# Patient Record
Sex: Male | Born: 1980 | Race: White | Hispanic: No | Marital: Single | Smoking: Current every day smoker
Health system: Southern US, Community
[De-identification: ages and names within clinical notes are randomized; demographics above are authoritative.]

## PROBLEM LIST (undated history)

## (undated) DIAGNOSIS — F1910 Other psychoactive substance abuse, uncomplicated: Secondary | ICD-10-CM

---

## 2003-10-15 ENCOUNTER — Emergency Department (HOSPITAL_COMMUNITY): Admission: EM | Admit: 2003-10-15 | Discharge: 2003-10-15 | Payer: Self-pay | Admitting: Emergency Medicine

## 2005-10-29 ENCOUNTER — Emergency Department (HOSPITAL_COMMUNITY): Admission: EM | Admit: 2005-10-29 | Discharge: 2005-10-29 | Payer: Self-pay | Admitting: Family Medicine

## 2007-07-03 ENCOUNTER — Emergency Department (HOSPITAL_COMMUNITY): Admission: EM | Admit: 2007-07-03 | Discharge: 2007-07-03 | Payer: Self-pay | Admitting: Emergency Medicine

## 2012-03-28 ENCOUNTER — Encounter (HOSPITAL_COMMUNITY): Payer: Self-pay | Admitting: *Deleted

## 2012-03-28 ENCOUNTER — Emergency Department (HOSPITAL_COMMUNITY)
Admission: EM | Admit: 2012-03-28 | Discharge: 2012-03-28 | Disposition: A | Discharge status: Home / Self Care | Payer: Self-pay | Attending: Emergency Medicine | Admitting: Emergency Medicine

## 2012-03-28 DIAGNOSIS — K047 Periapical abscess without sinus: Secondary | ICD-10-CM | POA: Insufficient documentation

## 2012-03-28 DIAGNOSIS — F17200 Nicotine dependence, unspecified, uncomplicated: Secondary | ICD-10-CM | POA: Insufficient documentation

## 2012-03-28 MED ORDER — KETOROLAC TROMETHAMINE 30 MG/ML IJ SOLN
30.0000 mg | Freq: Once | INTRAMUSCULAR | Status: AC
  Administered 2012-03-28: 30 mg via INTRAMUSCULAR
  Filled 2012-03-28: qty 1

## 2012-03-28 MED ORDER — TRAMADOL HCL 50 MG PO TABS: 50.0000 mg | ORAL_TABLET | Freq: Four times a day (QID) | ORAL | Status: DC | PRN

## 2012-03-28 MED ORDER — PENICILLIN V POTASSIUM 500 MG PO TABS: 500.0000 mg | ORAL_TABLET | Freq: Four times a day (QID) | ORAL | Status: DC

## 2012-03-28 MED ORDER — PENICILLIN V POTASSIUM 250 MG PO TABS
500.0000 mg | ORAL_TABLET | Freq: Once | ORAL | Status: AC
  Administered 2012-03-28: 500 mg via ORAL
  Filled 2012-03-28: qty 2

## 2012-03-28 NOTE — ED Notes (Signed)
Patient given discharge paperwork; went over discharge instructions with patient.  Patient instructed to take prescriptions as directed, to finish entire antibiotic prescription completely, to call dentist/referral first thing tomorrow morning, and to return to the ED for new, worsening, or concerning symptoms.

## 2012-03-28 NOTE — ED Notes (Addendum)
C/o L lower tooth pain, hurting on & off for 2-3 months, worse tonight. "think it is infected". Pain radiates into L throat, L ear and HA. (denies: nvd, fever), some dizziness. Last tylenol PTA, last ibuprofen 1 hr ago.

## 2012-03-28 NOTE — ED Provider Notes (Signed)
Medical screening examination/treatment/procedure(s) were performed by non-physician practitioner and as supervising physician I was immediately available for consultation/collaboration.  Olivia Mackie, MD 03/28/12 [phone]

## 2012-03-28 NOTE — ED Notes (Signed)
Patient complaining of dental pain in the bottom left side of his mouth; reports that he has had dental pain for the past two months.  Patient reports that he has not seen a dentist.  Has been taking Tylenol and ibuprofen for the pain.  Patient reports that he does not have a primary dentist.  Patient alert and oriented x4; PERRL present.  Will continue to monitor.

## 2012-03-28 NOTE — ED Provider Notes (Signed)
History     CSN: [id number]  Arrival date & time 03/28/12  8295   First MD Initiated Contact with Patient 03/28/12 0329      Chief Complaint  Patient presents with  . Dental Pain    (Consider location/radiation/quality/duration/timing/severity/associated sxs/prior treatment) HPI Comments: Planing of a large cavity in his left lower second molar that is acutely become more painful tonight.  He taken Tylenol and ibuprofen, without relief.  He does not have a dentist  Patient is a 31 y.o. male presenting with tooth pain. The history is provided by the patient.  Dental PainThe primary symptoms include mouth pain, oral lesions and cough. Primary symptoms do not include dental injury, headaches, fever or sore throat. The symptoms began 6 to 12 hours ago. The symptoms are worsening. The symptoms occur constantly.  Additional symptoms include: dental sensitivity to temperature, gum swelling and ear pain. Additional symptoms do not include: purulent gums, trismus, jaw pain, facial swelling, trouble swallowing and swollen glands.    History reviewed. No pertinent past medical history.  History reviewed. No pertinent past surgical history.  No family history on file.  History  Substance Use Topics  . Smoking status: Current Every Day Smoker  . Smokeless tobacco: Not on file  . Alcohol Use: Yes      Review of Systems  Constitutional: Negative for fever and chills.  HENT: Positive for ear pain and dental problem. Negative for congestion, sore throat, facial swelling, rhinorrhea, trouble swallowing, neck pain and sinus pressure.   Respiratory: Positive for cough.   Gastrointestinal: Negative for nausea.  Neurological: Negative for dizziness and headaches.    Allergies  Review of patient's allergies indicates no known allergies.  Home Medications   Current Outpatient Rx  Name Route Sig Dispense Refill  . PENICILLIN V POTASSIUM 500 MG PO TABS Oral Take 1 tablet (500 mg total) by  mouth 4 (four) times daily. 40 tablet 0  . TRAMADOL HCL 50 MG PO TABS Oral Take 1 tablet (50 mg total) by mouth every 6 (six) hours as needed for pain. 15 tablet 0    BP 139/80  Pulse 73  Temp 98.1 F (36.7 C) (Oral)  Resp 18  SpO2 94%  Physical Exam  Constitutional: He appears well-developed and well-nourished.  HENT:  Head: Normocephalic.  Mouth/Throat:    Eyes: Pupils are equal, round, and reactive to light.  Neck: Normal range of motion.  Cardiovascular: Normal rate.   Pulmonary/Chest: Effort normal.  Abdominal: Soft.  Musculoskeletal: Normal range of motion.  Neurological: He is alert.  Skin: Skin is warm.    ED Course  Procedures (including critical care time)  Labs Reviewed - No data to display No results found.   1. Dental abscess       MDM  Will treat for a dental abscess, and refer to dentist on call        Arman Filter, NP 03/28/12 0345

## 2014-06-19 ENCOUNTER — Emergency Department (HOSPITAL_COMMUNITY)
Admission: EM | Admit: 2014-06-19 | Discharge: 2014-06-20 | Disposition: A | Discharge status: Home / Self Care | Payer: Medicaid Other | Attending: Emergency Medicine | Admitting: Emergency Medicine

## 2014-06-19 ENCOUNTER — Emergency Department (HOSPITAL_COMMUNITY): Payer: Medicaid Other

## 2014-06-19 ENCOUNTER — Encounter (HOSPITAL_COMMUNITY): Payer: Self-pay | Admitting: Emergency Medicine

## 2014-06-19 DIAGNOSIS — W1839XA Other fall on same level, initial encounter: Secondary | ICD-10-CM | POA: Insufficient documentation

## 2014-06-19 DIAGNOSIS — S93401A Sprain of unspecified ligament of right ankle, initial encounter: Secondary | ICD-10-CM | POA: Insufficient documentation

## 2014-06-19 DIAGNOSIS — Y9289 Other specified places as the place of occurrence of the external cause: Secondary | ICD-10-CM | POA: Diagnosis not present

## 2014-06-19 DIAGNOSIS — S99921A Unspecified injury of right foot, initial encounter: Secondary | ICD-10-CM | POA: Diagnosis present

## 2014-06-19 DIAGNOSIS — Z792 Long term (current) use of antibiotics: Secondary | ICD-10-CM | POA: Diagnosis not present

## 2014-06-19 DIAGNOSIS — Y720 Diagnostic and monitoring otorhinolaryngological devices associated with adverse incidents: Secondary | ICD-10-CM | POA: Diagnosis not present

## 2014-06-19 DIAGNOSIS — Y998 Other external cause status: Secondary | ICD-10-CM | POA: Insufficient documentation

## 2014-06-19 DIAGNOSIS — R609 Edema, unspecified: Secondary | ICD-10-CM

## 2014-06-19 DIAGNOSIS — R52 Pain, unspecified: Secondary | ICD-10-CM

## 2014-06-19 DIAGNOSIS — Y9389 Activity, other specified: Secondary | ICD-10-CM | POA: Insufficient documentation

## 2014-06-19 MED ORDER — HYDROCODONE-ACETAMINOPHEN 5-325 MG PO TABS
2.0000 | ORAL_TABLET | Freq: Once | ORAL | Status: AC
  Administered 2014-06-19: 2 via ORAL
  Filled 2014-06-19: qty 2
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
## 2014-06-19 NOTE — ED Notes (Signed)
Pt. stepped on a hole , lost his balance and fell 2 days ago , presents with right foot pain/ swelling onset yesterday , ambulatory .

## 2014-06-20 MED ORDER — HYDROCODONE-ACETAMINOPHEN 5-325 MG PO TABS: 1.0000 | ORAL_TABLET | ORAL | Status: DC | PRN

## 2015-03-09 ENCOUNTER — Emergency Department (HOSPITAL_COMMUNITY): Payer: Medicaid Other

## 2015-03-09 ENCOUNTER — Emergency Department (HOSPITAL_COMMUNITY)
Admission: EM | Admit: 2015-03-09 | Discharge: 2015-03-09 | Disposition: A | Discharge status: Home / Self Care | Payer: Medicaid Other | Attending: Emergency Medicine | Admitting: Emergency Medicine

## 2015-03-09 ENCOUNTER — Encounter (HOSPITAL_COMMUNITY): Payer: Self-pay

## 2015-03-09 DIAGNOSIS — Y9389 Activity, other specified: Secondary | ICD-10-CM | POA: Insufficient documentation

## 2015-03-09 DIAGNOSIS — R092 Respiratory arrest: Secondary | ICD-10-CM | POA: Diagnosis not present

## 2015-03-09 DIAGNOSIS — T401X1A Poisoning by heroin, accidental (unintentional), initial encounter: Secondary | ICD-10-CM

## 2015-03-09 DIAGNOSIS — Y9289 Other specified places as the place of occurrence of the external cause: Secondary | ICD-10-CM | POA: Insufficient documentation

## 2015-03-09 DIAGNOSIS — Z720 Tobacco use: Secondary | ICD-10-CM | POA: Insufficient documentation

## 2015-03-09 DIAGNOSIS — Y998 Other external cause status: Secondary | ICD-10-CM | POA: Diagnosis not present

## 2015-03-09 DIAGNOSIS — R0789 Other chest pain: Secondary | ICD-10-CM | POA: Insufficient documentation

## 2015-03-09 LAB — CBC WITH DIFFERENTIAL/PLATELET
BASOS ABS: 0 10*3/uL (ref 0.0–0.1)
Basophils Relative: 0 % (ref 0–1)
EOS ABS: 0.2 10*3/uL (ref 0.0–0.7)
Eosinophils Relative: 1 % (ref 0–5)
HCT: 45.1 % (ref 39.0–52.0)
HEMOGLOBIN: 15.7 g/dL (ref 13.0–17.0)
LYMPHS ABS: 1.2 10*3/uL (ref 0.7–4.0)
LYMPHS PCT: 6 % — AB (ref 12–46)
MCH: 31.7 pg (ref 26.0–34.0)
MCHC: 34.8 g/dL (ref 30.0–36.0)
MCV: 90.9 fL (ref 78.0–100.0)
Monocytes Absolute: 1.2 10*3/uL — ABNORMAL HIGH (ref 0.1–1.0)
Monocytes Relative: 6 % (ref 3–12)
NEUTROS PCT: 87 % — AB (ref 43–77)
Neutro Abs: 17 10*3/uL — ABNORMAL HIGH (ref 1.7–7.7)
Platelets: 199 10*3/uL (ref 150–400)
RBC: 4.96 MIL/uL (ref 4.22–5.81)
RDW: 13.7 % (ref 11.5–15.5)
WBC: 19.5 10*3/uL — AB (ref 4.0–10.5)

## 2015-03-09 LAB — BASIC METABOLIC PANEL
ANION GAP: 6 (ref 5–15)
BUN: 14 mg/dL (ref 6–20)
CHLORIDE: 104 mmol/L (ref 101–111)
CO2: 29 mmol/L (ref 22–32)
Calcium: 8.6 mg/dL — ABNORMAL LOW (ref 8.9–10.3)
Creatinine, Ser: 1.08 mg/dL (ref 0.61–1.24)
GFR calc Af Amer: >60 mL/min (ref >60)
GFR calc non Af Amer: >60 mL/min (ref >60)
Glucose, Bld: 162 mg/dL — ABNORMAL HIGH (ref 65–99)
POTASSIUM: 3.5 mmol/L (ref 3.5–5.1)
SODIUM: 139 mmol/L (ref 135–145)

## 2015-03-09 LAB — CBG MONITORING, ED: GLUCOSE-CAPILLARY: 157 mg/dL — AB (ref 65–99)

## 2015-03-09 LAB — ETHANOL

## 2015-03-09 MED ORDER — NALOXONE HCL 1 MG/ML IJ SOLN
1.0000 mg | Freq: Once | INTRAMUSCULAR | Status: AC
  Administered 2015-03-09: 1 mg via INTRAVENOUS
  Filled 2015-03-09: qty 2

## 2015-03-09 NOTE — ED Notes (Signed)
Pt oxygen dropped to 89%. Pt placed on 2L oxygen. Oxygen increased to 96%

## 2015-03-09 NOTE — Discharge Instructions (Signed)
Accidental Overdose °A drug overdose occurs when a chemical substance (drug or medication) is used in amounts large enough to overcome a person. This may result in severe illness or death. This is a type of poisoning. Accidental overdoses of medications or other substances come from a variety of reasons. When this happens accidentally, it is often because the person taking the substance does not know enough about what they have taken. Drugs which commonly cause overdose deaths are alcohol, psychotropic medications (medications which affect the mind), pain medications, illegal drugs (street drugs) such as cocaine and heroin, and multiple drugs taken at the same time. It may result from careless behavior (such as over-indulging at a party). Other causes of overdose may include multiple drug use, a lapse in memory, or drug use after a period of no drug use.  °Sometimes overdosing occurs because a person cannot remember if they have taken their medication.  °A common unintentional overdose in young children involves multi-vitamins containing iron. Iron is a part of the hemoglobin molecule in blood. It is used to transport oxygen to living cells. When taken in small amounts, iron allows the body to restock hemoglobin. In large amounts, it causes problems in the body. If this overdose is not treated, it can lead to death. °Never take medicines that show signs of tampering or do not seem quite right. Never take medicines in the dark or in poor lighting. Read the label and check each dose of medicine before you take it. When adults are poisoned, it happens most often through carelessness or lack of information. Taking medicines in the dark or taking medicine prescribed for someone else to treat the same type of problem is a dangerous practice. °SYMPTOMS  °Symptoms of overdose depend on the medication and amount taken. They can vary from over-activity with stimulant over-dosage, to sleepiness from depressants such as  alcohol, narcotics and tranquilizers. Confusion, dizziness, nausea and vomiting may be present. If problems are severe enough coma and death may result. °DIAGNOSIS  °Diagnosis and management are generally straightforward if the drug is known. Otherwise it is more difficult. At times, certain symptoms and signs exhibited by the patient, or blood tests, can reveal the drug in question.  °TREATMENT  °In an emergency department, most patients can be treated with supportive measures. Antidotes may be available if there has been an overdose of opioids or benzodiazepines. A rapid improvement will often occur if this is the cause of overdose. °At home or away from medical care: °· There may be no immediate problems or warning signs in children. °· Not everything works well in all cases of poisoning. °· Take immediate action. Poisons may act quickly. °· If you think someone has swallowed medicine or a household product, and the person is unconscious, having seizures (convulsions), or is not breathing, immediately call for an ambulance. °IF a person is conscious and appears to be doing OK but has swallowed a poison: °· Do not wait to see what effect the poison will have. Immediately call a poison control center (listed in the white pages of your telephone book under "Poison Control" or inside the front cover with other emergency numbers). Some poison control centers have TTY capability for the deaf. Check with your local center if you or someone in your family requires this service. °· Keep the container so you can read the label on the product for ingredients. °· Describe what, when, and how much was taken and the age and condition of the person poisoned.   Inform them if the person is vomiting, choking, drowsy, shows a change in color or temperature of skin, is conscious or unconscious, or is convulsing. °· Do not cause vomiting unless instructed by medical personnel. Do not induce vomiting or force liquids into a person who  is convulsing, unconscious, or very drowsy. °Stay calm and in control.  °· Activated charcoal also is sometimes used in certain types of poisoning and you may wish to add a supply to your emergency medicines. It is available without a prescription. Call a poison control center before using this medication. °PREVENTION  °Thousands of children die every year from unintentional poisoning. This may be from household chemicals, poisoning from carbon monoxide in a car, taking their parent's medications, or simply taking a few iron pills or vitamins with iron. Poisoning comes from unexpected sources. °· Store medicines out of the sight and reach of children, preferably in a locked cabinet. Do not keep medications in a food cabinet. Always store your medicines in a secure place. Get rid of expired medications. °· If you have children living with you or have them as occasional guests, you should have child-resistant caps on your medicine containers. Keep everything out of reach. Child proof your home. °· If you are called to the telephone or to answer the door while you are taking a medicine, take the container with you or put the medicine out of the reach of small children. °· Do not take your medication in front of children. Do not tell your child how good a medication is and how good it is for them. They may get the idea it is more of a treat. °· If you are an adult and have accidentally taken an overdose, you need to consider how this happened and what can be done to prevent it from happening again. If this was from a street drug or alcohol, determine if there is a problem that needs addressing. If you are not sure a problems exists, it is easy to talk to a professional and ask them if they think you have a problem. It is better to handle this problem in this way before it happens again and has a much worse consequence. °Document Released: 09/05/2004 Document Revised: 09/14/2011 Document Reviewed: 02/11/2009 °ExitCare®  Patient Information ©2015 [employer]. This information is not intended to replace advice given to you by your health care provider. Make sure you discuss any questions you have with your health care provider. ° °Narcotic Overdose °A narcotic overdose is the misuse or overuse of a narcotic drug. A narcotic overdose can make you pass out and stop breathing. If you are not treated right away, this can cause permanent brain damage or stop your heart. Medicine may be given to reverse the effects of an overdose. If so, this medicine may bring on withdrawal symptoms. The symptoms may be abdominal cramps, throwing up (vomiting), sweating, chills, and nervousness. °Injecting narcotics can cause more problems than just an overdose. AIDS, hepatitis, and other very serious infections are transmitted by sharing needles and syringes. If you decide to quit using, there are medicines which can help you through the withdrawal period. Trying to quit all at once on your own can be uncomfortable, but not life-threatening. Call your caregiver, Narcotics Anonymous, or any drug and alcohol treatment program for further help.  °Document Released: 07/30/2004 Document Revised: 09/14/2011 Document Reviewed: 05/24/2009 °ExitCare® Patient Information ©2015 [employer]. This information is not intended to replace advice given to you by your health   care provider. Make sure you discuss any questions you have with your health care provider. °Substance Abuse Treatment Programs ° °Intensive Outpatient Programs °[facility]     °[address]      °[place], [place]                   °[phone]      ° °[facility] °[address] °[place], [place] °[phone] ° °[facility]     °(Inpatient and outpatient)     °[address]           °[phone]   ° °[facility] °[phone] (Suboxone and Methadone) ° °[address]      °[place], [place] [address]        °[phone]      ° °[address] °[place], [place] °852-3033 ° °[facility] (Outpatient/Inpatient, Chemical)    °(insurance only) [phone]      °       °[facility] (Groups & Residential) °[place], [place] °[phone] ° °   °[facility]     °[address]     °[place], [place]      °[phone]      ° °[facility] (for caregivers and family) °[address] °[place], [place] °336-299-4655 ° ° ° ° ° °Residential Treatment Programs °[facility]      °[address], [place], [place] [address]  °([phone]      ° °[facility] °[address] [place], [place] [address] °[phone] ° °[facility]        °336-248-8914      ° °[facility] °1-800-659-3381 ° °[facility]             °[address]                                         °[place], [place]                                                °877-615-2722 or 336-784-9470                              ° °[facility] [place] °[address] °[place], [address] °1.877.941.8954 ° °[facility]    °[address]      °[place], [place]     °[phone]      ° °[facility] °[address] °[place], [place] °336-285-9073 ° °[facility]   °[address]     °[place], [place] [address]     °[phone]      °Admissions: 8am-3pm M-F ° °[facility] °[address] °[place], [place] °336-227-7417 ° °[facility]: Residential Program (90 Days)   °[place], [place]      °336-725-8389 or 800-758-6077    ° °[facility] °[place],  °(Walk in Hours over the weekend or by referral) ° °[facility] °[address], [place],  [address] °([phone] ° °Crisis Mobile: [facility]:  1-877-626-1772 (for crisis response 24 hours a day) °[facility]   Hotline:      1-800-256-2452 °Outpatient Psychiatry and Counseling ° °[facility]:  Mobile Crisis Management 24 hours:  1-877-626-1772 ° °[facility] sliding scale fee and walk in schedule: M-F 8am-12pm/1pm-3pm °[address]  °[place], [place] [address] °[phone] ° °[facility] °[address] °[place], [place] [address] °[phone] ° °[facility] ([facility])- new patient walk-in appointments available Monday - Friday 8am -3pm.          °[address] °[place], [place] [address] °336-676-6840 or crisis line- 336-676-6905 ° °[facility]/ Intensive Outpatient Therapy Program °[address] °[place], [place] [address] °336-832-9804 ° °[facility]                  °Crisis Services      °[phone]      °[address]     °[place], [place] [address]                ° °[facility]   °[facility] °[phone] °[address] °[place], [place] [address] ° ° °[facility]          °[address]  °[place], [place] [address]       °([phone] ° °[facility] °[address] °[place], [place] [address] °336-292-1510 ° °[facility]    °[address]    °[place], [place] [address]     °[phone]      ° °Parish McKinney, MD     °[address]     °[place] [address]     °[phone]     °  °[facility] °[address] °[place] [address] ° °[facility]     °[address]     °[place], [place]      °336-542-2076      ° °[facility] °Shamsher Ahluwalia, MD °[address] °[place], [place] [address] °[phone] ° °[facility]     °[address]     °[place], [place] [address]     °336-274-1237      ° °[facility] °[address] °[place], [place] [address] °336-854-4450 °Resources for Temporary Residential Assistance/Crisis Centers ° °[facility]ENT[facility] °[facility] °M-F 8am-3pm   °[address] [place], [place] [address]   336-332-0824 °Services include: laundry, barbering, support groups, case management, phone  & computer access, showers, AA/NA mtgs, mental health/substance abuse nurse, job skills class, disability information, VA assistance, spiritual classes, etc.  ° °HOMELESS SHELTERS ° °[facility]     °[facility]   °[address], [place]     °336.271.5959       °       °[facility] (women and children)       °[address] °[place],  [address] °336-275-0820 °[email or web address] for application and process °Application Required ° °[facility]   °[address]    °[place]  [address]     °[phone]       °             °[facility] °[address] °[place],  [address] °336.273.5572 °336-235-0363(schedule application appt.) °Application Required ° °[facility] (women only)    °[address]     °  [place], [place] [address]     °336-884-1039      °Intake starts 6pm daily °Need valid ID, SSC, & Police report °[facility] [place] °[address] °[place], [place] °336-881-5420 °Application Required ° °[facility] (men only)     °[address]      °[place], [place]     °[phone]      ° °[facility] °(Pregnant women only) °[address] °[place], [place] °336-275-0206 ° °[facility]      °[address]      °[place], [place] [address]     °[phone]      °       °[facility] °[address] °[place], [place] °336-723-1848 °90 day commitment/SA/Application process ° °[facility](men only)     °[address]     °[place], [place]     °[phone]       °Check-in at 7pm     °       °[facility] °[address] °[place], [place] [address] °336-248-6684 °Men/Women/Women and Children must be there by 7 pm ° °[facility] °[place],  [place] °336-722-8721                ° °
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
## 2015-03-09 NOTE — ED Provider Notes (Signed)
CSN: [id number]     Arrival date & time 03/09/15  1031 History  This chart was scribed for Gilda Crease, MD by Chestine Spore, ED Scribe. The patient was seen in room APA02/APA02 at 10:30 AM.     Chief Complaint  Patient presents with  . post cardiac arrest       The history is provided by the patient and the EMS personnel. No language interpreter was used.     HPI Comments: Saleem Coccia is a 34 y.o. male who presents to the Emergency Department via EMS complaining of heroine overdose onset PTA. Pt notes that he did 1 bag of heroine (0.1 grams) that he injected in his arm and he was unresponsive following. Pt notes that he had been up all night prior to injecting herione. Pt does not remember what happened after he injected the drug, he just remembers everyone near him when he woke up. Per EMS pt had CPR completed by the fire department and family prior to the arrival of EMS. Pt was given 2 mg of Narcan inter-nasally by law enforcement and when EMS arrived they gave the pt 2 an additional MG of Narcan through IV and the pt vitals stabilized afterwards.  He states that he is having associated symptoms of left sided chest soreness and fatigue. He denies any other symptoms..Denies taking daily medications.   History reviewed. No pertinent past medical history. History reviewed. No pertinent past surgical history. No family history on file. Social History  Substance Use Topics  . Smoking status: Current Every Day Smoker  . Smokeless tobacco: None  . Alcohol Use: Yes     Comment: occ    Review of Systems  Constitutional: Positive for fatigue.  Respiratory:       Left sided chest wall tenderness  All other systems reviewed and are negative.     Allergies  Review of patient's allergies indicates no known allergies.  Home Medications   Prior to Admission medications   Not on File   BP 123/71 mmHg  Pulse 98  Temp(Src) 97.9 F (36.6 C) (Oral)  Resp 20  Ht  (1.88 m)  Wt  230 lb (104.327 kg)  BMI 29.52 kg/m2  SpO2 97% Physical Exam  Constitutional: He is oriented to person, place, and time. He appears well-developed and well-nourished. No distress.  HENT:  Head: Normocephalic and atraumatic.  Right Ear: Hearing normal.  Left Ear: Hearing normal.  Nose: Nose normal.  Mouth/Throat: Oropharynx is clear and moist and mucous membranes are normal.  Eyes: Conjunctivae and EOM are normal. Pupils are equal, round, and reactive to light.  Neck: Normal range of motion. Neck supple.  Cardiovascular: Regular rhythm, S1 normal and S2 normal.  Exam reveals no gallop and no friction rub.   No murmur heard. Pulmonary/Chest: Effort normal and breath sounds normal. No respiratory distress. He exhibits no tenderness.  Left sided chest wall tenderness  Abdominal: Soft. Normal appearance and bowel sounds are normal. There is no hepatosplenomegaly. There is no tenderness. There is no rebound, no guarding, no tenderness at McBurney's point and negative Murphy's sign. No hernia.  Musculoskeletal: Normal range of motion.  Neurological: He is alert and oriented to person, place, and time. He has normal strength. No cranial nerve deficit or sensory deficit. Coordination normal. GCS eye subscore is 4. GCS verbal subscore is 5. GCS motor subscore is 6.  Skin: Skin is warm, dry and intact. No rash noted. No cyanosis.  Psychiatric: He has a  normal mood and affect. His speech is normal and behavior is normal. Thought content normal.  Nursing note and vitals reviewed.   ED Course  Procedures (including critical care time) COORDINATION OF CARE: 10:36 AM Discussed treatment plan with pt at bedside and pt agreed to plan.    Labs Review Labs Reviewed  CBC WITH DIFFERENTIAL/PLATELET - Abnormal; Notable for the following:    WBC 19.5 (*)    Neutrophils Relative % 87 (*)    Neutro Abs 17.0 (*)    Lymphocytes Relative 6 (*)    Monocytes Absolute 1.2 (*)    All other components within  normal limits  BASIC METABOLIC PANEL - Abnormal; Notable for the following:    Glucose, Bld 162 (*)    Calcium 8.6 (*)    All other components within normal limits  CBG MONITORING, ED - Abnormal; Notable for the following:    Glucose-Capillary 157 (*)    All other components within normal limits  ETHANOL  URINE RAPID DRUG SCREEN, HOSP PERFORMED    Imaging Review Dg Chest 2 View  03/09/2015   CLINICAL DATA:  34 year old male with respiratory distress. Heroin overdose.  EXAM: CHEST  2 VIEW  COMPARISON:  No priors.  FINDINGS: Lung volumes are normal. No consolidative airspace disease. No pleural effusions. No pneumothorax. No pulmonary nodule or mass noted. Pulmonary vasculature and the cardiomediastinal silhouette are within normal limits.  IMPRESSION: No radiographic evidence of acute cardiopulmonary disease.   Electronically Signed   By: Trudie Reed M.D.   On: 03/09/2015 11:09   Jaci Carrel, MD has personally reviewed and evaluated these images and lab results as part of my medical decision-making.    EKG Interpretation   Date/Time:  Saturday March 09 2015 10:34:11 EDT Ventricular Rate:  99 PR Interval:  190 QRS Duration: 83 QT Interval:  327 QTC Calculation: 420 R Axis:   81 Text Interpretation:  Sinus rhythm Anteroseptal infarct, age indeterminate  No previous tracing Confirmed by Ellene Bloodsaw  MD, Rowan Pollman [phone]) on  03/09/2015 10:39:00 AM      MDM   Final diagnoses:  Respiratory arrest  Heroin overdose, accidental or unintentional, initial encounter    Presents to the ER by ambulance after unintentional overdose on heroin. Patient was administered CPR by family and first responders. After they administered Narcan, patient became awake and alert. He is groggy at arrival, but oriented and alert. Patient has spontaneous respirations, normal vital signs. Workup has been unremarkable.  Overdose was accidental. Patient is not homicidal or suicidal. He declines  admission. He is awake, alert and oriented. He has the capacity to declined admission. Patient monitored for an extended period of time, continues to do well. Patient to be discharged, given resources for primary care and heroin abuse.  I personally performed the services described in this documentation, which was scribed in my presence. The recorded information has been reviewed and is accurate.     Gilda Crease, MD 03/09/15 1505

## 2015-05-19 ENCOUNTER — Emergency Department (HOSPITAL_COMMUNITY)
Admission: EM | Admit: 2015-05-19 | Discharge: 2015-05-19 | Disposition: A | Discharge status: Home / Self Care | Payer: Medicaid Other | Attending: Emergency Medicine | Admitting: Emergency Medicine

## 2015-05-19 ENCOUNTER — Encounter (HOSPITAL_COMMUNITY): Payer: Self-pay | Admitting: Emergency Medicine

## 2015-05-19 DIAGNOSIS — F17200 Nicotine dependence, unspecified, uncomplicated: Secondary | ICD-10-CM | POA: Insufficient documentation

## 2015-05-19 DIAGNOSIS — M546 Pain in thoracic spine: Secondary | ICD-10-CM | POA: Diagnosis present

## 2015-05-19 DIAGNOSIS — R112 Nausea with vomiting, unspecified: Secondary | ICD-10-CM | POA: Diagnosis not present

## 2015-05-19 DIAGNOSIS — R109 Unspecified abdominal pain: Secondary | ICD-10-CM | POA: Insufficient documentation

## 2015-05-19 DIAGNOSIS — Z7982 Long term (current) use of aspirin: Secondary | ICD-10-CM | POA: Diagnosis not present

## 2015-05-19 LAB — URINALYSIS, ROUTINE W REFLEX MICROSCOPIC
Bilirubin Urine: NEGATIVE
Glucose, UA: NEGATIVE mg/dL
KETONES UR: NEGATIVE mg/dL
LEUKOCYTES UA: NEGATIVE
NITRITE: NEGATIVE
PROTEIN: NEGATIVE mg/dL
Specific Gravity, Urine: 1.016 (ref 1.005–1.030)
UROBILINOGEN UA: 0.2 mg/dL (ref 0.0–1.0)
pH: 8 (ref 5.0–8.0)

## 2015-05-19 LAB — URINE MICROSCOPIC-ADD ON

## 2015-05-19 MED ORDER — IBUPROFEN 800 MG PO TABS: 800.0000 mg | ORAL_TABLET | Freq: Three times a day (TID) | ORAL | Status: DC

## 2015-05-19 MED ORDER — CYCLOBENZAPRINE HCL 10 MG PO TABS
10.0000 mg | ORAL_TABLET | Freq: Once | ORAL | Status: AC
  Administered 2015-05-19: 10 mg via ORAL
  Filled 2015-05-19: qty 1

## 2015-05-19 MED ORDER — KETOROLAC TROMETHAMINE 30 MG/ML IJ SOLN
30.0000 mg | Freq: Once | INTRAMUSCULAR | Status: AC
  Administered 2015-05-19: 30 mg via INTRAMUSCULAR
  Filled 2015-05-19: qty 1

## 2015-05-19 MED ORDER — OXYCODONE-ACETAMINOPHEN 5-325 MG PO TABS
1.0000 | ORAL_TABLET | Freq: Once | ORAL | Status: AC
  Administered 2015-05-19: 1 via ORAL
  Filled 2015-05-19: qty 1

## 2015-05-19 MED ORDER — ONDANSETRON 4 MG PO TBDP: 4.0000 mg | ORAL_TABLET | Freq: Three times a day (TID) | ORAL | Status: DC | PRN

## 2015-05-19 MED ORDER — PROMETHAZINE HCL 12.5 MG PO TABS
12.5000 mg | ORAL_TABLET | Freq: Once | ORAL | Status: AC
  Administered 2015-05-19: 12.5 mg via ORAL
  Filled 2015-05-19: qty 1

## 2015-05-19 MED ORDER — ONDANSETRON 4 MG PO TBDP
4.0000 mg | ORAL_TABLET | Freq: Once | ORAL | Status: AC | PRN
  Administered 2015-05-19: 4 mg via ORAL
  Filled 2015-05-19: qty 1

## 2015-05-19 MED ORDER — CYCLOBENZAPRINE HCL 10 MG PO TABS: 10.0000 mg | ORAL_TABLET | Freq: Two times a day (BID) | ORAL | Status: DC | PRN

## 2015-05-19 NOTE — ED Provider Notes (Signed)
CSN: [id number]     Arrival date & time 05/19/15  0845 History   First MD Initiated Contact with Patient 05/19/15 0901     Chief Complaint  Patient presents with  . Back Pain     (Consider location/radiation/quality/duration/timing/severity/associated sxs/prior Treatment) HPI   Patient is a 34 year old male who presents to the ED with complaint of left middle back pain. Patient reports he has been having back pain for the past month. He notes while he was at work he was bending over and moving heavy objects at the onset of his back pain. He reports having intermittent sharp pain in his left middle back that worsens with movement or lifting. Patient states his pain worsen last night while he was bending over to grab an object. Pt denies fever, numbness, tingling, saddle anesthesia, loss of bowel or bladder, weakness, IVDU, cancer or recent spinal manipulation. He endorses having nausea with one episode of vomiting in the ED. [facility]. Denies any other recent fall, trauma, injury.   History reviewed. No pertinent past medical history. History reviewed. No pertinent past surgical history. No family history on file. Social History  Substance Use Topics  . Smoking status: Current Every Day Smoker  . Smokeless tobacco: None  . Alcohol Use: Yes     Comment: occ    Review of Systems  Gastrointestinal: Positive for nausea, vomiting and abdominal pain.  Musculoskeletal: Positive for back pain.  All other systems reviewed and are negative.     Allergies  Review of patient's allergies indicates no known allergies.  Home Medications   Prior to Admission medications   Medication Sig Start Date End Date Taking? Authorizing Provider  Aspirin POWD by Does not apply route.   Yes Historical Provider, MD  cyclobenzaprine (FLEXERIL) 10 MG tablet Take 1 tablet (10 mg total) by mouth 2 (two) times daily as needed for muscle spasms. 05/19/15   Barrett HenleNicole Elizabeth Shannara Winbush,  PA-C  ibuprofen (ADVIL,MOTRIN) 800 MG tablet Take 1 tablet (800 mg total) by mouth 3 (three) times daily. 05/19/15   Satira SarkNicole Elizabeth Hue Frick, PA-C   BP 136/100 mmHg  Pulse 74  Temp(Src) 97.6 F (36.4 C) (Oral)  Resp 18  SpO2 100% Physical Exam  Constitutional: He is oriented to person, place, and time. He appears well-developed and well-nourished. No distress.  HENT:  Head: Normocephalic and atraumatic.  Mouth/Throat: Oropharynx is clear and moist. No oropharyngeal exudate.  Eyes: Conjunctivae and EOM are normal. Pupils are equal, round, and reactive to light. Right eye exhibits no discharge. Left eye exhibits no discharge. No scleral icterus.  Neck: Normal range of motion. Neck supple.  Cardiovascular: Normal rate, regular rhythm, normal heart sounds and intact distal pulses.   Pulmonary/Chest: Effort normal and breath sounds normal. No respiratory distress. He has no wheezes. He has no rales. He exhibits no tenderness.  Abdominal: Soft. Bowel sounds are normal. He exhibits no distension and no mass. There is no tenderness. There is no rebound and no guarding.  Musculoskeletal: Normal range of motion. He exhibits tenderness. He exhibits no edema.       Cervical back: He exhibits normal range of motion, no tenderness, no bony tenderness, no swelling, no edema, no deformity, no laceration and no spasm.       Thoracic back: He exhibits tenderness (left paraspinal muscles). He exhibits normal range of motion, no bony tenderness, no swelling, no edema, no deformity, no pain and no spasm.  Lumbar back: He exhibits normal range of motion, no tenderness, no bony tenderness, no swelling, no edema, no deformity, no laceration and no spasm.  No C/T/L midline tenderness. FROM of back. Pt able to stand and ambulate without assistance. 5/5 strength BLE. No erythema or swelling noted to back.  Lymphadenopathy:    He has no cervical adenopathy.  Neurological: He is alert and oriented to person,  place, and time. He has normal strength and normal reflexes. No sensory deficit. Coordination and gait normal.  Skin: Skin is warm and dry. He is not diaphoretic.  Nursing note and vitals reviewed.   ED Course  Procedures (including critical care time) Labs Review Labs Reviewed  URINALYSIS, ROUTINE W REFLEX MICROSCOPIC (NOT AT [facility]) - Abnormal; Notable for the following:    Hgb urine dipstick SMALL (*)    All other components within normal limits  URINE MICROSCOPIC-ADD ON    Imaging Review No results found. I have personally reviewed and evaluated these images and lab results as part of my medical decision-making.    MDM   Final diagnoses:  Left-sided thoracic back pain    Patient presents with left mid back pain. Reports bending down and moving a heavy object at work a month ago bridges. Started. Reports pain is worse with movement, bending or lifting. He states pain worsen last night while he was bending down to pick up an object. Denies any other recent fall, trauma, injury. VSS. Exam reveals tenderness at lower mid left paraspinal muscles, no spasm noted. No midline tenderness. Full range of motion of back, patient able to stand and ambulate. No back pain red flags.  After reviewing patient's chart I saw that he was seen in the ED on 03/09/15 for post cardiac arrest resulting from IV heroin use. I spoke with patient regarding IV drug use and he denies using any IV drugs since then. He reports he took 1 oxycodone last night for pain with reported relief. Exam revealed no signs or symptoms to suggest epidural abscess or cauda equina syndrome. I suspect patient's pain is likely due to musculoskeletal etiology. Pt reports his back pain has improved. Plan to d/c pt home with NSAIDs and muscle relaxant. Strict return precautions given which include fever, numbness, tingling, saddle anesthesia, loss of bowel or bladder, weakness. Pt given resource guide to follow up with PCP.  Evaluation  does not show pathology requring ongoing emergent intervention or admission. Pt is hemodynamically stable and mentating appropriately. Discussed findings/results and plan with patient/guardian, who agrees with plan. All questions answered.   Satira Sark [place], [place] 05/19/15 1349  Laurence Spates, MD 05/19/15 [phone]

## 2015-05-19 NOTE — Discharge Instructions (Signed)
Take your pain medications as prescribed as needed for pain relief. You may also apply heat to her back for pain relief. Follow-up with your primary care provider in 4-5 days. Please return to the Emergency Department if symptoms worsen or new onset of fever, numbness, tingling, groin numbness, loss of bowel or bladder control, weakness.

## 2015-05-19 NOTE — ED Notes (Signed)
Patient states last night he started to experience severe back pain after bending down to move an object.  He states he currently feels nauseous  and has difficulty sitting due to the pain.  Patient in no apparent distress at this time.

## 2016-09-08 DIAGNOSIS — R294 Clicking hip: Secondary | ICD-10-CM | POA: Diagnosis not present

## 2016-11-24 DIAGNOSIS — J40 Bronchitis, not specified as acute or chronic: Secondary | ICD-10-CM | POA: Diagnosis not present

## 2016-11-24 DIAGNOSIS — J019 Acute sinusitis, unspecified: Secondary | ICD-10-CM | POA: Diagnosis not present

## 2016-11-24 DIAGNOSIS — M25521 Pain in right elbow: Secondary | ICD-10-CM | POA: Diagnosis not present

## 2017-09-26 ENCOUNTER — Emergency Department (HOSPITAL_COMMUNITY)
Admission: EM | Admit: 2017-09-26 | Discharge: 2017-09-26 | Disposition: A | Discharge status: Home / Self Care | Payer: Medicaid Other | Attending: Emergency Medicine | Admitting: Emergency Medicine

## 2017-09-26 ENCOUNTER — Other Ambulatory Visit: Payer: Self-pay

## 2017-09-26 ENCOUNTER — Encounter (HOSPITAL_COMMUNITY): Payer: Self-pay | Admitting: *Deleted

## 2017-09-26 DIAGNOSIS — T401X1A Poisoning by heroin, accidental (unintentional), initial encounter: Secondary | ICD-10-CM | POA: Diagnosis present

## 2017-09-26 DIAGNOSIS — F17210 Nicotine dependence, cigarettes, uncomplicated: Secondary | ICD-10-CM | POA: Diagnosis not present

## 2017-09-26 MED ORDER — ACETAMINOPHEN 325 MG PO TABS
650.0000 mg | ORAL_TABLET | Freq: Once | ORAL | Status: AC
  Administered 2017-09-26: 650 mg via ORAL
  Filled 2017-09-26: qty 2

## 2017-09-26 NOTE — ED Provider Notes (Signed)
[facility] EMERGENCY DEPARTMENT Provider Note   CSN: [id number] Arrival date & time: 09/26/17  0259     History   Chief Complaint Chief Complaint  Patient presents with  . Drug Overdose    HPI Ian Ali is a 37 y.o. male.  The history is provided by the patient and the EMS personnel.  He has history of IV drug abuse and comes in after having had an overdose of heroin.  He admits to injecting heroin tonight.  EMS found him with agonal respirations and gave naloxone 2 mg.  He woke up following that.  He is complaining of a headache, but denies any other complaints.  He denies other drug use.  History reviewed. No pertinent past medical history.  There are no active problems to display for this patient.   History reviewed. No pertinent surgical history.      Home Medications    Prior to Admission medications   Medication Sig Start Date End Date Taking? Authorizing Provider  Aspirin POWD by Does not apply route.    [provider]  cyclobenzaprine (FLEXERIL) 10 MG tablet Take 1 tablet (10 mg total) by mouth 2 (two) times daily as needed for muscle spasms. 05/19/15   Barrett Henle, PA-C  ibuprofen (ADVIL,MOTRIN) 800 MG tablet Take 1 tablet (800 mg total) by mouth 3 (three) times daily. 05/19/15   Barrett Henle, PA-C  ondansetron (ZOFRAN ODT) 4 MG disintegrating tablet Take 1 tablet (4 mg total) by mouth every 8 (eight) hours as needed for nausea or vomiting. 05/19/15   Barrett Henle, PA-C    Family History History reviewed. No pertinent family history.  Social History Social History   Tobacco Use  . Smoking status: Current Every Day Smoker    Packs/day: 1.00    Types: Cigarettes  . Smokeless tobacco: Never Used  Substance Use Topics  . Alcohol use: Yes    Comment: occ  . Drug use: Yes    Types: IV    Comment: heroin 4 x a week     Allergies   Patient has no known allergies.   Review of Systems Review of Systems    All other systems reviewed and are negative.    Physical Exam Updated Vital Signs BP (!) 128/91 (BP Location: Right Arm)   Pulse 95   Temp 97.6 F (36.4 C) (Oral)   Resp (!) 23   Ht 5\' 11"  (1.803 m)   Wt 111.1 kg (245 lb)   SpO2 95%   BMI 34.17 kg/m   Physical Exam  Nursing note and vitals reviewed.  37 year old male, resting comfortably and in no acute distress. Vital signs are significant for borderline elevated diastolic blood pressure. Oxygen saturation is 95%, which is normal. Head is normocephalic and atraumatic. PERRLA, EOMI. Oropharynx is clear. Neck is nontender and supple without adenopathy or JVD. Back is nontender and there is no CVA tenderness. Lungs are clear without rales, wheezes, or rhonchi. Chest is nontender. Heart has regular rate and rhythm without murmur. Abdomen is soft, flat, nontender without masses or hepatosplenomegaly and peristalsis is normoactive. Extremities have no cyanosis or edema, full range of motion is present. Skin is warm and dry without rash. Neurologic: Mental status is normal, cranial nerves are intact, there are no motor or sensory deficits.  ED Treatments / Results   EKG EKG Interpretation  Date/Time:  Sunday September 26 2017 03:02:50 EDT Ventricular Rate:  98 PR Interval:    QRS Duration:  84 QT Interval:  349 QTC Calculation: 446 R Axis:   80 Text Interpretation:  Sinus rhythm Normal ECG When compared with ECG of 03/09/2015, No significant change was found Confirmed by Dione BoozeGlick, Aliz Meritt ([id number]) on 09/26/2017 3:31:09 AM   Procedures Procedures  Medications Ordered in ED Medications  acetaminophen (TYLENOL) tablet 650 mg (650 mg Oral Given 09/26/17 0407)     Initial Impression / Assessment and Plan / ED Course  I have reviewed the triage vital signs and the nursing notes.  Pertinent labs & imaging results that were available during my care of the patient were reviewed by me and considered in my medical decision making (see  chart for details).  Opioid overdose-most likely fentanyl.  He is currently maintaining good oxygen saturation on room air.  Old records are reviewed, and he has a prior ED visit for heroin overdose.  He will be observed in the ED for 4 hours.  If he does not require additional naloxone in that time, he can safely be discharged.  7:28 AM He has been observed in the ED for 4hours during which time he has maintained normal blood pressure and normal oxygen saturation without supplemental oxygen.  He is felt to be safe for discharge at this point.  Advised to abstain from illicit drugs.  Final Clinical Impressions(s) / ED Diagnoses   Final diagnoses:  Accidental overdose of heroin, initial encounter [facility](HCC)    ED Discharge Orders    None       Dione BoozeGlick, Baljit Liebert, MD 09/26/17 [phone]

## 2017-09-26 NOTE — Discharge Instructions (Addendum)
Do not use heroin, or any other illegal drug. Only take medication that is prescribed by a doctor or PA or Nurse Practitioner.

## 2017-09-26 NOTE — ED Notes (Signed)
[facility] in with pt

## 2017-09-26 NOTE — ED Triage Notes (Addendum)
Per [facility], they were called for a heroin overdose. Upon [facility] arrival, pt was unresponsive with agonal respirations. Pt was bagged upon arrival, pt did have a pulse. Pt was given 2mg  of Narcan by [facility]. Pt alert and oriented upon arrival. Pt has a hx of iv heroin use. Pt states overdose was unintentional. CBG on scene was 245.  Pt also reports a headache, requesting tylenol.

## 2017-09-26 NOTE — ED Triage Notes (Signed)
Family had started chest compressions on pt for a few minutes.

## 2017-10-06 NOTE — Progress Notes (Deleted)
Subjective: ZO:XWRUEAVWUCC:establish care, *** HPI: Ian Ali is a 37 y.o. male presenting to clinic today for:  1. ***  No past medical history on file. No past surgical history on file. Social History   Socioeconomic History  . Marital status: Single    Spouse name: Not on file  . Number of children: Not on file  . Years of education: Not on file  . Highest education level: Not on file  Occupational History  . Not on file  Social Needs  . Financial resource strain: Not on file  . Food insecurity:    Worry: Not on file    Inability: Not on file  . Transportation needs:    Medical: Not on file    Non-medical: Not on file  Tobacco Use  . Smoking status: Current Every Day Smoker    Packs/day: 1.00    Types: Cigarettes  . Smokeless tobacco: Never Used  Substance and Sexual Activity  . Alcohol use: Yes    Comment: occ  . Drug use: Yes    Types: IV    Comment: heroin 4 x a week  . Sexual activity: Not on file  Lifestyle  . Physical activity:    Days per week: Not on file    Minutes per session: Not on file  . Stress: Not on file  Relationships  . Social connections:    Talks on phone: Not on file    Gets together: Not on file    Attends religious service: Not on file    Active member of club or organization: Not on file    Attends meetings of clubs or organizations: Not on file    Relationship status: Not on file  . Intimate partner violence:    Fear of current or ex partner: Not on file    Emotionally abused: Not on file    Physically abused: Not on file    Forced sexual activity: Not on file  Other Topics Concern  . Not on file  Social History Narrative  . Not on file   No outpatient medications have been marked as taking for the 10/08/17 encounter (Appointment) with Raliegh IpGottschalk, Wilfrido Luedke M, DO.   No family history on file. No Known Allergies   Health Maintenance: ***  Flu Vaccine: {YES/NO/WILD JWJXB:14782}CARDS:18581}  Tdap Vaccine: {YES/NO/WILD NFAOZ:30865}CARDS:18581}  - every  2030yrs - (<3 lifetime doses or unknown): all wounds -- look up need for Tetanus IG - (>=3 lifetime doses): clean/minor wound if >2930yrs from previous; all other wounds if >5523yrs from previous Zoster Vaccine: {YES/NO/WILD CARDS:18581} (those >50yo, once) Pneumonia Vaccine: {YES/NO/WILD HQION:62952}CARDS:18581} (those w/ risk factors) - (<71102yr) Both: Immunocompromised, cochlear implant, CSF leak, asplenic, sickle cell, Chronic Renal Failure - (<58102yr) PPSV-23 only: Heart dz, lung disease, DM, tobacco abuse, alcoholism, cirrhosis/liver disease. - (>15102yr): PPSV13 then PPSV23 in 6-12mths;  - (>62102yr): repeat PPSV23 once if pt received prior to 36yo and 2923yrs have passed  ROS: Per HPI  Objective: Office vital signs reviewed. There were no vitals taken for this visit.  Physical Examination:  General: Awake, alert, *** nourished, No acute distress HEENT: Normal    Neck: No masses palpated. No lymphadenopathy    Ears: Tympanic membranes intact, normal light reflex, no erythema, no bulging    Eyes: PERRLA, extraocular movement in tact, sclera ***    Nose: nasal turbinates moist, *** nasal discharge    Throat: moist mucus membranes, no erythema, *** tonsillar exudate.  Airway is patent Cardio: regular rate and rhythm, S1S2  heard, no murmurs appreciated Pulm: clear to auscultation bilaterally, no wheezes, rhonchi or rales; normal work of breathing on room air GI: soft, non-tender, non-distended, bowel sounds present x4, no hepatomegaly, no splenomegaly, no masses GU: external vaginal tissue ***, cervix ***, *** punctate lesions on cervix appreciated, *** discharge from cervical os, *** bleeding, *** cervical motion tenderness, *** abdominal/ adnexal masses Extremities: warm, well perfused, No edema, cyanosis or clubbing; +*** pulses bilaterally MSK: *** gait and *** station Skin: dry; intact; no rashes or lesions Neuro: *** Strength and light touch sensation grossly intact, *** DTRs ***/4  Assessment/ Plan: 37  y.o. male   No problem-specific Assessment & Plan notes found for this encounter.   Raliegh Ip, DO [facility] [phone]

## 2017-10-08 ENCOUNTER — Ambulatory Visit: Payer: Self-pay | Admitting: Family Medicine

## 2017-10-08 ENCOUNTER — Encounter: Payer: Self-pay | Admitting: Family Medicine

## 2018-01-21 DIAGNOSIS — S63614A Unspecified sprain of right ring finger, initial encounter: Secondary | ICD-10-CM | POA: Diagnosis not present

## 2018-02-24 DIAGNOSIS — G8929 Other chronic pain: Secondary | ICD-10-CM | POA: Diagnosis not present

## 2018-02-24 DIAGNOSIS — M7031 Other bursitis of elbow, right elbow: Secondary | ICD-10-CM | POA: Diagnosis not present

## 2018-02-24 DIAGNOSIS — M79641 Pain in right hand: Secondary | ICD-10-CM | POA: Diagnosis not present

## 2018-03-16 DIAGNOSIS — M19041 Primary osteoarthritis, right hand: Secondary | ICD-10-CM | POA: Diagnosis not present

## 2018-03-16 DIAGNOSIS — M7021 Olecranon bursitis, right elbow: Secondary | ICD-10-CM | POA: Diagnosis not present

## 2018-10-02 ENCOUNTER — Encounter (HOSPITAL_COMMUNITY): Payer: Self-pay | Admitting: Emergency Medicine

## 2018-10-02 ENCOUNTER — Emergency Department (HOSPITAL_COMMUNITY)
Admission: EM | Admit: 2018-10-02 | Discharge: 2018-10-02 | Disposition: A | Discharge status: Home / Self Care | Payer: Medicaid Other | Attending: Emergency Medicine | Admitting: Emergency Medicine

## 2018-10-02 ENCOUNTER — Other Ambulatory Visit: Payer: Self-pay

## 2018-10-02 DIAGNOSIS — Y939 Activity, unspecified: Secondary | ICD-10-CM | POA: Diagnosis not present

## 2018-10-02 DIAGNOSIS — F329 Major depressive disorder, single episode, unspecified: Secondary | ICD-10-CM

## 2018-10-02 DIAGNOSIS — F17210 Nicotine dependence, cigarettes, uncomplicated: Secondary | ICD-10-CM | POA: Diagnosis not present

## 2018-10-02 DIAGNOSIS — M7021 Olecranon bursitis, right elbow: Secondary | ICD-10-CM | POA: Insufficient documentation

## 2018-10-02 DIAGNOSIS — F32A Depression, unspecified: Secondary | ICD-10-CM

## 2018-10-02 DIAGNOSIS — M7031 Other bursitis of elbow, right elbow: Secondary | ICD-10-CM | POA: Diagnosis not present

## 2018-10-02 HISTORY — DX: Other psychoactive substance abuse, uncomplicated: F19.10

## 2018-10-02 LAB — CBC
HCT: 47.2 % (ref 39.0–52.0)
HEMOGLOBIN: 16.2 g/dL (ref 13.0–17.0)
MCH: 30.6 pg (ref 26.0–34.0)
MCHC: 34.3 g/dL (ref 30.0–36.0)
MCV: 89.2 fL (ref 80.0–100.0)
Platelets: 255 10*3/uL (ref 150–400)
RBC: 5.29 MIL/uL (ref 4.22–5.81)
RDW: 13.2 % (ref 11.5–15.5)
WBC: 12 10*3/uL — AB (ref 4.0–10.5)
nRBC: 0 % (ref 0.0–0.2)

## 2018-10-02 LAB — COMPREHENSIVE METABOLIC PANEL
ALBUMIN: 4.7 g/dL (ref 3.5–5.0)
ALK PHOS: 73 U/L (ref 38–126)
ALT: 34 U/L (ref 0–44)
AST: 24 U/L (ref 15–41)
Anion gap: 13 (ref 5–15)
BILIRUBIN TOTAL: 0.5 mg/dL (ref 0.3–1.2)
BUN: 16 mg/dL (ref 6–20)
CALCIUM: 9.7 mg/dL (ref 8.9–10.3)
CO2: 22 mmol/L (ref 22–32)
Chloride: 104 mmol/L (ref 98–111)
Creatinine, Ser: 1.25 mg/dL — ABNORMAL HIGH (ref 0.61–1.24)
GFR calc Af Amer: >60 mL/min (ref >60)
GFR calc non Af Amer: >60 mL/min (ref >60)
GLUCOSE: 106 mg/dL — AB (ref 70–99)
POTASSIUM: 3.8 mmol/L (ref 3.5–5.1)
Sodium: 139 mmol/L (ref 135–145)
Total Protein: 8.2 g/dL — ABNORMAL HIGH (ref 6.5–8.1)

## 2018-10-02 LAB — ETHANOL

## 2018-10-02 MED ORDER — KETOROLAC TROMETHAMINE 60 MG/2ML IM SOLN
60.0000 mg | Freq: Once | INTRAMUSCULAR | Status: AC
  Administered 2018-10-02: 60 mg via INTRAMUSCULAR
  Filled 2018-10-02: qty 2

## 2018-10-02 MED ORDER — PREDNISONE 10 MG PO TABS: ORAL_TABLET | ORAL | 0 refills | Status: DC

## 2018-10-02 MED ORDER — NAPROXEN 500 MG PO TABS: 500.0000 mg | ORAL_TABLET | Freq: Two times a day (BID) | ORAL | 0 refills | Status: DC

## 2018-10-02 NOTE — ED Triage Notes (Addendum)
Pt states he has problem with heroin and ETOH use and is depressed. States he thought about killing self last night but did not really have a plan. States he was "just down". Last used heroin around 2-3pm. Pt also has chronic R elbow pain and swelling. States that he thinks he needs surgery.

## 2018-10-02 NOTE — Discharge Instructions (Addendum)
Ice pack to elbow.  Prescription for anti-inflammatory medicine and prednisone.  Recommend Narcotics Anonymous and counseling.  Number for [employer] given.

## 2018-10-02 NOTE — ED Provider Notes (Signed)
[facility] EMERGENCY DEPARTMENT Provider Note   CSN: [id number] Arrival date & time: 10/02/18  1857    History   Chief Complaint Chief Complaint  Patient presents with  . V70.1    HPI Ian Ali is a 38 y.o. male.     Patient is depressed.  He is not currently suicidal.  He recently injected heroin and snorted amphetamines.  He also drinks alcohol.  He does not have a steady job.  He lives with his parents and helps him around the house.  Review of systems positive for chronic intermittent inflamed right elbow.  He has not been to counseling     Past Medical History:  Diagnosis Date  . Substance abuse (HCC)     There are no active problems to display for this patient.   History reviewed. No pertinent surgical history.      Home Medications    Prior to Admission medications   Medication Sig Start Date End Date Taking? Authorizing Provider  Aspirin POWD by Does not apply route.    [provider]  naproxen (NAPROSYN) 500 MG tablet Take 1 tablet (500 mg total) by mouth 2 (two) times daily. 10/02/18   Donnetta Hutching, MD  predniSONE (DELTASONE) 10 MG tablet 3 tabs for 3 days, 2 tabs for 3 days, 1 tab for 3 days. 10/02/18   Donnetta Hutching, MD    Family History No family history on file.  Social History Social History   Tobacco Use  . Smoking status: Current Every Day Smoker    Packs/day: 1.00    Types: Cigarettes  . Smokeless tobacco: Never Used  Substance Use Topics  . Alcohol use: Yes    Comment: occ  . Drug use: Yes    Types: IV    Comment: heroin 4 x a week     Allergies   Patient has no known allergies.   Review of Systems Review of Systems  All other systems reviewed and are negative.    Physical Exam Updated Vital Signs BP (!) 153/93 (BP Location: Right Arm)   Pulse (!) 105   Temp 98.7 F (37.1 C) (Oral)   Resp 18   Ht 6' (1.829 m)   Wt 115.7 kg   SpO2 98%   BMI 34.58 kg/m   Physical Exam Vitals signs and nursing note  reviewed.  Constitutional:      Appearance: He is well-developed.  HENT:     Head: Normocephalic and atraumatic.  Eyes:     Conjunctiva/sclera: Conjunctivae normal.  Neck:     Musculoskeletal: Neck supple.  Cardiovascular:     Rate and Rhythm: Normal rate and regular rhythm.  Pulmonary:     Effort: Pulmonary effort is normal.     Breath sounds: Normal breath sounds.  Abdominal:     General: Bowel sounds are normal.     Palpations: Abdomen is soft.  Musculoskeletal:     Comments: Slightly tender tip of right elbow  Skin:    General: Skin is warm and dry.  Neurological:     Mental Status: He is alert and oriented to person, place, and time.  Psychiatric:     Comments: Flat affect, depressed      ED Treatments / Results  Labs (all labs ordered are listed, but only abnormal results are displayed) Labs Reviewed  COMPREHENSIVE METABOLIC PANEL - Abnormal; Notable for the following components:      Result Value   Glucose, Bld 106 (*)    Creatinine, Ser  1.25 (*)    Total Protein 8.2 (*)    All other components within normal limits  CBC - Abnormal; Notable for the following components:   WBC 12.0 (*)    All other components within normal limits  ETHANOL  RAPID URINE DRUG SCREEN, HOSP PERFORMED    EKG None  Radiology No results found.  Procedures Procedures (including critical care time)  Medications Ordered in ED Medications  ketorolac (TORADOL) injection 60 mg (60 mg Intramuscular Given 10/02/18 2102)     Initial Impression / Assessment and Plan / ED Course  I have reviewed the triage vital signs and the nursing notes.  Pertinent labs & imaging results that were available during my care of the patient were reviewed by me and considered in my medical decision making (see chart for details).        Patient presents with depression and polysubstance abuse.  He is not currently suicidal.  Does not meet inpatient criteria.  Recommended Narcotics Anonymous and  outpatient counseling.  Rx Naprosyn 500 mg and prednisone for his right elbow.  Final Clinical Impressions(s) / ED Diagnoses   Final diagnoses:  Depression, unspecified depression type  Olecranon bursitis of right elbow    ED Discharge Orders         Ordered    naproxen (NAPROSYN) 500 MG tablet  2 times daily     10/02/18 2050    predniSONE (DELTASONE) 10 MG tablet     10/02/18 2050           Donnetta Hutching, MD 10/02/18 2148

## 2019-03-18 ENCOUNTER — Emergency Department (HOSPITAL_COMMUNITY): Payer: Medicaid Other

## 2019-03-18 ENCOUNTER — Emergency Department (HOSPITAL_COMMUNITY)
Admission: EM | Admit: 2019-03-18 | Discharge: 2019-03-18 | Disposition: A | Discharge status: Home / Self Care | Payer: Medicaid Other | Attending: Emergency Medicine | Admitting: Emergency Medicine

## 2019-03-18 ENCOUNTER — Encounter (HOSPITAL_COMMUNITY): Payer: Self-pay | Admitting: Emergency Medicine

## 2019-03-18 ENCOUNTER — Other Ambulatory Visit: Payer: Self-pay

## 2019-03-18 DIAGNOSIS — N200 Calculus of kidney: Secondary | ICD-10-CM | POA: Diagnosis not present

## 2019-03-18 DIAGNOSIS — R1030 Lower abdominal pain, unspecified: Secondary | ICD-10-CM | POA: Diagnosis present

## 2019-03-18 DIAGNOSIS — Z79899 Other long term (current) drug therapy: Secondary | ICD-10-CM | POA: Diagnosis not present

## 2019-03-18 DIAGNOSIS — F17210 Nicotine dependence, cigarettes, uncomplicated: Secondary | ICD-10-CM | POA: Diagnosis not present

## 2019-03-18 DIAGNOSIS — K529 Noninfective gastroenteritis and colitis, unspecified: Secondary | ICD-10-CM | POA: Insufficient documentation

## 2019-03-18 LAB — COMPREHENSIVE METABOLIC PANEL
ALT: 28 U/L (ref 0–44)
AST: 21 U/L (ref 15–41)
Albumin: 4.8 g/dL (ref 3.5–5.0)
Alkaline Phosphatase: 64 U/L (ref 38–126)
Anion gap: 13 (ref 5–15)
BUN: 15 mg/dL (ref 6–20)
CO2: 24 mmol/L (ref 22–32)
Calcium: 9.8 mg/dL (ref 8.9–10.3)
Chloride: 100 mmol/L (ref 98–111)
Creatinine, Ser: 1.12 mg/dL (ref 0.61–1.24)
GFR calc Af Amer: >60 mL/min (ref >60)
GFR calc non Af Amer: >60 mL/min (ref >60)
Glucose, Bld: 146 mg/dL — ABNORMAL HIGH (ref 70–99)
Potassium: 3.3 mmol/L — ABNORMAL LOW (ref 3.5–5.1)
Sodium: 137 mmol/L (ref 135–145)
Total Bilirubin: 1.1 mg/dL (ref 0.3–1.2)
Total Protein: 9 g/dL — ABNORMAL HIGH (ref 6.5–8.1)

## 2019-03-18 LAB — URINALYSIS, ROUTINE W REFLEX MICROSCOPIC
Bacteria, UA: NONE SEEN
Bilirubin Urine: NEGATIVE
Glucose, UA: NEGATIVE mg/dL
Ketones, ur: NEGATIVE mg/dL
Leukocytes,Ua: NEGATIVE
Nitrite: NEGATIVE
Protein, ur: >=300 mg/dL — AB
Specific Gravity, Urine: 1.022 (ref 1.005–1.030)
pH: 6 (ref 5.0–8.0)

## 2019-03-18 LAB — LIPASE, BLOOD: Lipase: 25 U/L (ref 11–51)

## 2019-03-18 LAB — CBC
HCT: 53.6 % — ABNORMAL HIGH (ref 39.0–52.0)
Hemoglobin: 18.1 g/dL — ABNORMAL HIGH (ref 13.0–17.0)
MCH: 29.2 pg (ref 26.0–34.0)
MCHC: 33.8 g/dL (ref 30.0–36.0)
MCV: 86.5 fL (ref 80.0–100.0)
Platelets: 289 10*3/uL (ref 150–400)
RBC: 6.2 MIL/uL — ABNORMAL HIGH (ref 4.22–5.81)
RDW: 13.8 % (ref 11.5–15.5)
WBC: 12.1 10*3/uL — ABNORMAL HIGH (ref 4.0–10.5)
nRBC: 0 % (ref 0.0–0.2)

## 2019-03-18 MED ORDER — SODIUM CHLORIDE 0.9 % IV SOLN
INTRAVENOUS | Status: DC
  Administered 2019-03-18: 18:00:00 via INTRAVENOUS

## 2019-03-18 MED ORDER — SODIUM CHLORIDE 0.9 % IV BOLUS
1000.0000 mL | Freq: Once | INTRAVENOUS | Status: AC
  Administered 2019-03-18: 1000 mL via INTRAVENOUS

## 2019-03-18 MED ORDER — IOHEXOL 300 MG/ML  SOLN
100.0000 mL | Freq: Once | INTRAMUSCULAR | Status: AC | PRN
  Administered 2019-03-18: 100 mL via INTRAVENOUS

## 2019-03-18 MED ORDER — ONDANSETRON 4 MG PO TBDP: 4.0000 mg | ORAL_TABLET | Freq: Three times a day (TID) | ORAL | 1 refills | Status: DC | PRN

## 2019-03-18 MED ORDER — ONDANSETRON HCL 4 MG/2ML IJ SOLN
4.0000 mg | Freq: Once | INTRAMUSCULAR | Status: AC
  Administered 2019-03-18: 4 mg via INTRAVENOUS
  Filled 2019-03-18: qty 2

## 2019-03-18 NOTE — ED Provider Notes (Signed)
[facility] EMERGENCY DEPARTMENT Provider Note   CSN: [id number] Arrival date & time: 03/18/19  1335     History   Chief Complaint Chief Complaint  Patient presents with   Abdominal Pain    HPI Ian Ali is a 38 y.o. male.     Patient with complaint of lower abdominal pain.  Somewhat suprapubic.  Little bit of left side.  Ongoing for 3 days.  Associated with nausea vomiting and some loose bowel movements.  On Thursday evening patient vomited about 15 times.  Today's vomited twice.     Past Medical History:  Diagnosis Date   Substance abuse (HCC)     There are no active problems to display for this patient.   History reviewed. No pertinent surgical history.      Home Medications    Prior to Admission medications   Medication Sig Start Date End Date Taking? Authorizing Provider  gabapentin (NEURONTIN) 300 MG capsule Take 1,200 mg by mouth at bedtime. 01/23/19  Yes [provider]  SUBOXONE 8-2 MG FILM Place 1 Film under the tongue every 8 (eight) hours. 03/09/19  Yes [provider]  ondansetron (ZOFRAN ODT) 4 MG disintegrating tablet Take 1 tablet (4 mg total) by mouth every 8 (eight) hours as needed. 03/18/19   Vanetta MuldersZackowski, [place] Lusty, MD    Family History No family history on file.  Social History Social History   Tobacco Use   Smoking status: Current Every Day Smoker    Packs/day: 1.00    Types: Cigarettes   Smokeless tobacco: Never Used  Substance Use Topics   Alcohol use: Not Currently    Comment: occ   Drug use: Not Currently    Types: IV    Comment: heroin 4 x a week. Quit 4 months ago     Allergies   Patient has no known allergies.   Review of Systems Review of Systems  Constitutional: Negative for chills and fever.  HENT: Negative for congestion, rhinorrhea and sore throat.   Eyes: Negative for visual disturbance.  Respiratory: Negative for cough and shortness of breath.   Cardiovascular: Negative for chest pain and leg  swelling.  Gastrointestinal: Positive for abdominal pain, diarrhea, nausea and vomiting.  Genitourinary: Negative for dysuria.  Musculoskeletal: Negative for back pain and neck pain.  Skin: Negative for rash.  Neurological: Negative for dizziness, light-headedness and headaches.  Hematological: Does not bruise/bleed easily.  Psychiatric/Behavioral: Negative for confusion.     Physical Exam Updated Vital Signs BP (!) 150/86    Pulse 91    Temp 98.3 F (36.8 C) (Oral)    Resp 19    Ht 1.803 m (5\' 11" )    Wt 108.9 kg    SpO2 99%    BMI 33.47 kg/m   Physical Exam Vitals signs and nursing note reviewed.  Constitutional:      General: He is not in acute distress.    Appearance: Normal appearance. He is well-developed.  HENT:     Head: Normocephalic and atraumatic.  Eyes:     Extraocular Movements: Extraocular movements intact.     Conjunctiva/sclera: Conjunctivae normal.     Pupils: Pupils are equal, round, and reactive to light.  Neck:     Musculoskeletal: Normal range of motion and neck supple.  Cardiovascular:     Rate and Rhythm: Normal rate and regular rhythm.     Heart sounds: No murmur.  Pulmonary:     Effort: Pulmonary effort is normal. No respiratory distress.  Breath sounds: Normal breath sounds.  Abdominal:     Palpations: Abdomen is soft.     Tenderness: There is no abdominal tenderness. There is no guarding.  Musculoskeletal: Normal range of motion.  Skin:    General: Skin is warm and dry.  Neurological:     General: No focal deficit present.     Mental Status: He is alert and oriented to person, place, and time.      ED Treatments / Results  Labs (all labs ordered are listed, but only abnormal results are displayed) Labs Reviewed  COMPREHENSIVE METABOLIC PANEL - Abnormal; Notable for the following components:      Result Value   Potassium 3.3 (*)    Glucose, Bld 146 (*)    Total Protein 9.0 (*)    All other components within normal limits  CBC -  Abnormal; Notable for the following components:   WBC 12.1 (*)    RBC 6.20 (*)    Hemoglobin 18.1 (*)    HCT 53.6 (*)    All other components within normal limits  URINALYSIS, ROUTINE W REFLEX MICROSCOPIC - Abnormal; Notable for the following components:   Hgb urine dipstick MODERATE (*)    Protein, ur >=300 (*)    All other components within normal limits  LIPASE, BLOOD    EKG None  Radiology Ct Abdomen Pelvis W Contrast  Result Date: 03/18/2019 CLINICAL DATA:  38 year old male with abdominal and pelvic pain for 2 days. EXAM: CT ABDOMEN AND PELVIS WITH CONTRAST TECHNIQUE: Multidetector CT imaging of the abdomen and pelvis was performed using the standard protocol following bolus administration of intravenous contrast. CONTRAST:  OMNIPAQUE IOHEXOL 300 MG/ML  SOLN COMPARISON:  None. FINDINGS: Lower chest: No acute abnormality. Hepatobiliary: The liver and gallbladder are unremarkable. No biliary dilatation. Pancreas: Unremarkable Spleen: Unremarkable Adrenals/Urinary Tract: The kidneys, adrenal glands and bladder are unremarkable except for a punctate nonobstructing RIGHT renal calculus and a small RIGHT renal cyst. Stomach/Bowel: Stomach is within normal limits. Appendix appears normal. No evidence of bowel wall thickening, distention, or inflammatory changes. Vascular/Lymphatic: No significant vascular findings are present. No enlarged abdominal or pelvic lymph nodes. Reproductive: Prostate is unremarkable. Other: No ascites, focal collection, pneumoperitoneum or abdominal wall hernia. Musculoskeletal: No acute or suspicious bony abnormalities identified. Bilateral L5 pars defects noted without spondylolisthesis. IMPRESSION: 1. No evidence of acute abnormality. No CT findings to suggest a cause for this patient's abdominal pain. 2. Punctate nonobstructing RIGHT renal calculus. 3. 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 Electronically Signed   By: Harmon Pier M.D.   On:  03/18/2019 19:23    Procedures Procedures (including critical care time)  Medications Ordered in ED Medications  0.9 %  sodium chloride infusion ( Intravenous New Bag/Given 03/18/19 1751)  sodium chloride 0.9 % bolus 1,000 mL (1,000 mLs Intravenous New Bag/Given 03/18/19 1744)  ondansetron (ZOFRAN) injection 4 mg (4 mg Intravenous Given 03/18/19 1744)  iohexol (OMNIPAQUE) 300 MG/ML solution 100 mL (100 mLs Intravenous Contrast Given 03/18/19 1847)     Initial Impression / Assessment and Plan / ED Course  I have reviewed the triage vital signs and the nursing notes.  Pertinent labs & imaging results that were available during my care of the patient were reviewed by me and considered in my medical decision making (see chart for details).      CT scan of the abdomen without any acute findings.  Labs without any significant abnormality.  Urine not consistent with urinary tract infection.  There was some protein in the urine.  Mild leukocytosis with a white count of 12,000.  Patient feels much better after IV fluid hydration.  Symptoms may be consistent with a Gaster enteritis.  No evidence of any acute abdominal process.  Will discharge home work note provided to be out of work till Monday.  Will have patient take Zofran as needed for nausea and vomiting.  He will return for any new or worse symptoms.    Final Clinical Impressions(s) / ED Diagnoses   Final diagnoses:  Lower abdominal pain  Gastroenteritis    ED Discharge Orders         Ordered    ondansetron (ZOFRAN ODT) 4 MG disintegrating tablet  Every 8 hours PRN     03/18/19 1954           Fredia Sorrow, MD 03/18/19 2000

## 2019-03-18 NOTE — Discharge Instructions (Addendum)
Work-up including CT scan of the abdomen without any acute findings.  Symptoms may be secondary to a viral gastroenteritis.  Take the Zofran as needed for nausea and vomiting.  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  Return for any new or worse symptoms.

## 2019-03-18 NOTE — ED Triage Notes (Signed)
Pt mid lower abdominal pain with n/v/d x 2 days. Pt denies fever.

## 2019-04-26 DIAGNOSIS — Z20828 Contact with and (suspected) exposure to other viral communicable diseases: Secondary | ICD-10-CM | POA: Diagnosis not present

## 2019-05-19 ENCOUNTER — Other Ambulatory Visit: Payer: Self-pay

## 2019-05-19 DIAGNOSIS — Z20822 Contact with and (suspected) exposure to covid-19: Secondary | ICD-10-CM

## 2019-05-19 DIAGNOSIS — Z20828 Contact with and (suspected) exposure to other viral communicable diseases: Secondary | ICD-10-CM | POA: Diagnosis not present

## 2019-05-21 LAB — NOVEL CORONAVIRUS, NAA: SARS-CoV-2, NAA: NOT DETECTED

## 2019-05-22 ENCOUNTER — Telehealth: Payer: Self-pay | Admitting: *Deleted

## 2019-05-22 NOTE — Telephone Encounter (Signed)
Patient called requesting COVID results ,was given negative results and also signed  up with my chart .

## 2019-09-27 ENCOUNTER — Other Ambulatory Visit: Payer: Self-pay

## 2019-09-27 ENCOUNTER — Encounter (HOSPITAL_COMMUNITY): Payer: Self-pay | Admitting: Emergency Medicine

## 2019-09-27 ENCOUNTER — Inpatient Hospital Stay (HOSPITAL_COMMUNITY)
Admission: EM | Admit: 2019-09-27 | Discharge: 2019-09-29 | DRG: 603 | Disposition: A | Discharge status: Home / Self Care | Payer: Medicaid Other | Attending: Internal Medicine | Admitting: Internal Medicine

## 2019-09-27 ENCOUNTER — Emergency Department (HOSPITAL_COMMUNITY): Payer: Medicaid Other

## 2019-09-27 DIAGNOSIS — L0390 Cellulitis, unspecified: Secondary | ICD-10-CM | POA: Diagnosis not present

## 2019-09-27 DIAGNOSIS — M79671 Pain in right foot: Secondary | ICD-10-CM | POA: Diagnosis not present

## 2019-09-27 DIAGNOSIS — F17210 Nicotine dependence, cigarettes, uncomplicated: Secondary | ICD-10-CM | POA: Diagnosis present

## 2019-09-27 DIAGNOSIS — L03011 Cellulitis of right finger: Secondary | ICD-10-CM | POA: Diagnosis not present

## 2019-09-27 DIAGNOSIS — Z20822 Contact with and (suspected) exposure to covid-19: Secondary | ICD-10-CM | POA: Diagnosis present

## 2019-09-27 DIAGNOSIS — L03115 Cellulitis of right lower limb: Secondary | ICD-10-CM | POA: Diagnosis not present

## 2019-09-27 DIAGNOSIS — S99921A Unspecified injury of right foot, initial encounter: Secondary | ICD-10-CM | POA: Diagnosis not present

## 2019-09-27 DIAGNOSIS — S99911A Unspecified injury of right ankle, initial encounter: Secondary | ICD-10-CM | POA: Diagnosis not present

## 2019-09-27 DIAGNOSIS — L03116 Cellulitis of left lower limb: Secondary | ICD-10-CM | POA: Diagnosis not present

## 2019-09-27 DIAGNOSIS — F1110 Opioid abuse, uncomplicated: Secondary | ICD-10-CM | POA: Diagnosis present

## 2019-09-27 DIAGNOSIS — M25571 Pain in right ankle and joints of right foot: Secondary | ICD-10-CM | POA: Diagnosis not present

## 2019-09-27 DIAGNOSIS — F1990 Other psychoactive substance use, unspecified, uncomplicated: Secondary | ICD-10-CM | POA: Diagnosis not present

## 2019-09-27 DIAGNOSIS — S41112A Laceration without foreign body of left upper arm, initial encounter: Secondary | ICD-10-CM | POA: Diagnosis not present

## 2019-09-27 DIAGNOSIS — M7989 Other specified soft tissue disorders: Secondary | ICD-10-CM | POA: Diagnosis not present

## 2019-09-27 DIAGNOSIS — Z03818 Encounter for observation for suspected exposure to other biological agents ruled out: Secondary | ICD-10-CM | POA: Diagnosis not present

## 2019-09-27 LAB — CBC WITH DIFFERENTIAL/PLATELET
Abs Immature Granulocytes: 0.09 10*3/uL — ABNORMAL HIGH (ref 0.00–0.07)
Basophils Absolute: 0.1 10*3/uL (ref 0.0–0.1)
Basophils Relative: 1 %
Eosinophils Absolute: 0.3 10*3/uL (ref 0.0–0.5)
Eosinophils Relative: 2 %
HCT: 45.1 % (ref 39.0–52.0)
Hemoglobin: 14.9 g/dL (ref 13.0–17.0)
Immature Granulocytes: 1 %
Lymphocytes Relative: 10 %
Lymphs Abs: 1.4 10*3/uL (ref 0.7–4.0)
MCH: 29.6 pg (ref 26.0–34.0)
MCHC: 33 g/dL (ref 30.0–36.0)
MCV: 89.7 fL (ref 80.0–100.0)
Monocytes Absolute: 1.1 10*3/uL — ABNORMAL HIGH (ref 0.1–1.0)
Monocytes Relative: 8 %
Neutro Abs: 10.8 10*3/uL — ABNORMAL HIGH (ref 1.7–7.7)
Neutrophils Relative %: 78 %
Platelets: 287 10*3/uL (ref 150–400)
RBC: 5.03 MIL/uL (ref 4.22–5.81)
RDW: 13.1 % (ref 11.5–15.5)
WBC: 13.7 10*3/uL — ABNORMAL HIGH (ref 4.0–10.5)
nRBC: 0 % (ref 0.0–0.2)

## 2019-09-27 LAB — SARS CORONAVIRUS 2 (TAT 6-24 HRS): SARS Coronavirus 2: NEGATIVE

## 2019-09-27 LAB — COMPREHENSIVE METABOLIC PANEL
ALT: 25 U/L (ref 0–44)
AST: 18 U/L (ref 15–41)
Albumin: 3.4 g/dL — ABNORMAL LOW (ref 3.5–5.0)
Alkaline Phosphatase: 88 U/L (ref 38–126)
Anion gap: 9 (ref 5–15)
BUN: 10 mg/dL (ref 6–20)
CO2: 29 mmol/L (ref 22–32)
Calcium: 9.1 mg/dL (ref 8.9–10.3)
Chloride: 98 mmol/L (ref 98–111)
Creatinine, Ser: 0.93 mg/dL (ref 0.61–1.24)
GFR calc Af Amer: >60 mL/min (ref >60)
GFR calc non Af Amer: >60 mL/min (ref >60)
Glucose, Bld: 103 mg/dL — ABNORMAL HIGH (ref 70–99)
Potassium: 3.8 mmol/L (ref 3.5–5.1)
Sodium: 136 mmol/L (ref 135–145)
Total Bilirubin: 0.5 mg/dL (ref 0.3–1.2)
Total Protein: 8.5 g/dL — ABNORMAL HIGH (ref 6.5–8.1)

## 2019-09-27 LAB — LACTIC ACID, PLASMA
Lactic Acid, Venous: 1.5 mmol/L (ref 0.5–1.9)
Lactic Acid, Venous: 1 mmol/L (ref 0.5–1.9)

## 2019-09-27 LAB — C-REACTIVE PROTEIN: CRP: 7.4 mg/dL — ABNORMAL HIGH (ref <1.0)

## 2019-09-27 LAB — BRAIN NATRIURETIC PEPTIDE: B Natriuretic Peptide: 111 pg/mL — ABNORMAL HIGH (ref 0.0–100.0)

## 2019-09-27 LAB — ETHANOL: Alcohol, Ethyl (B): <10 mg/dL (ref <10)

## 2019-09-27 MED ORDER — ACETAMINOPHEN 650 MG RE SUPP: 650.0000 mg | Freq: Four times a day (QID) | RECTAL | Status: DC | PRN

## 2019-09-27 MED ORDER — ENOXAPARIN SODIUM 40 MG/0.4ML ~~LOC~~ SOLN: 40.0000 mg | SUBCUTANEOUS | Status: DC

## 2019-09-27 MED ORDER — ONDANSETRON HCL 4 MG PO TABS: 4.0000 mg | ORAL_TABLET | Freq: Four times a day (QID) | ORAL | Status: DC | PRN

## 2019-09-27 MED ORDER — POLYETHYLENE GLYCOL 3350 17 G PO PACK: 17.0000 g | PACK | Freq: Every day | ORAL | Status: DC | PRN

## 2019-09-27 MED ORDER — VANCOMYCIN HCL 1500 MG/300ML IV SOLN
1500.0000 mg | Freq: Two times a day (BID) | INTRAVENOUS | Status: DC
  Administered 2019-09-28 – 2019-09-29 (×3): 1500 mg via INTRAVENOUS
  Filled 2019-09-27 (×3): qty 300

## 2019-09-27 MED ORDER — SODIUM CHLORIDE 0.9 % IV SOLN
2.0000 g | Freq: Three times a day (TID) | INTRAVENOUS | Status: DC
  Administered 2019-09-27 – 2019-09-29 (×6): 2 g via INTRAVENOUS
  Filled 2019-09-27 (×7): qty 2

## 2019-09-27 MED ORDER — ACETAMINOPHEN 325 MG PO TABS: 650.0000 mg | ORAL_TABLET | Freq: Four times a day (QID) | ORAL | Status: DC | PRN

## 2019-09-27 MED ORDER — VANCOMYCIN HCL 2000 MG/400ML IV SOLN
2000.0000 mg | Freq: Once | INTRAVENOUS | Status: AC
  Administered 2019-09-27: 2000 mg via INTRAVENOUS
  Filled 2019-09-27: qty 400

## 2019-09-27 MED ORDER — ONDANSETRON HCL 4 MG/2ML IJ SOLN: 4.0000 mg | Freq: Four times a day (QID) | INTRAMUSCULAR | Status: DC | PRN

## 2019-09-27 MED ORDER — SODIUM CHLORIDE 0.9 % IV BOLUS
1000.0000 mL | Freq: Once | INTRAVENOUS | Status: AC
  Administered 2019-09-27: 1000 mL via INTRAVENOUS

## 2019-09-27 NOTE — ED Notes (Signed)
Pt in bed, pt states that his foot pain has decreased, pt in bed watching tv, resps even and unlabored

## 2019-09-27 NOTE — TOC Initial Note (Signed)
Transition of Care [facility]) - Initial/Assessment Note    Patient Details  Name: Ian Ali MRN: [id number] Date of Birth: [birth year]  Transition of Care [facility]) CM/SW Contact:    Sherie Don, LCSW Phone Number: 09/27/2019, 6:38 PM  Clinical Narrative: Received consult for substance use services. Met with patient to discuss substance use resources. Patient reported he has used outpatient services in the past, but does not currently receive services. Provided patient with a list of outpatient resources including information for a substance use clinic, [facility], that accepts Medicaid and is located in [place], [place] as patient is from [place] and substance use resources are more limited near patient's residence.  Activities of Daily Living Home Assistive Devices/Equipment: None ADL Screening (condition at time of admission) Patient's cognitive ability adequate to safely complete daily activities?: Yes Is the patient deaf or have difficulty hearing?: No Does the patient have difficulty seeing, even when wearing glasses/contacts?: No Does the patient have difficulty concentrating, remembering, or making decisions?: No Patient able to express need for assistance with ADLs?: No Does the patient have difficulty dressing or bathing?: No Independently performs ADLs?: Yes (appropriate for developmental age) Does the patient have difficulty walking or climbing stairs?: No Weakness of Legs: None Weakness of Arms/Hands: None  Admission diagnosis:  Cellulitis [L03.90] Wound cellulitis [L03.90] Patient Active Problem List   Diagnosis Date Noted  . Cellulitis 09/27/2019  . 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 [place] [address]Phone: [phone]: [phone] [id number] [place] [address] [place]: [phone]: [phone] Risk Interventions No flowsheet data found.

## 2019-09-27 NOTE — H&P (Addendum)
History and Physical    Ian Ali:[id number] DOB: 1981-01-29 DOA: 09/27/2019  PCP: Patient, No Pcp Per   Patient coming from: Home  I have personally briefly reviewed patient's old medical records in [facility]  Chief Complaint: Leg swelling, hand and leg wounds  HPI: Ian Ali is a 39 y.o. male with medical history significant for IVDU.  Patient presented to the ED with complaints of bilateral foot pain and rash on both hands and lower extremities over the past week.  Reports some swelling involving his feet only.  He denies fevers or chills, no joint pain, no vomiting, no cough or difficulty breathing, no vision change.  He reports he last used heroin was 4 days ago, injected into bilateral upper extremities, he also sometimes snorts it.  He denies alcohol use, denies any other illicit drug use.  He is not on medication and he has no other medical problems.  ED Course: T-max 99.4.  WBC 13.7.  Normal lactic acid 1.5.  CRP mildly elevated at 7.4.  Unremarkable BMP.  Chest x-ray clear, right ankle x-ray and right foot x-ray unremarkable.  There was a concern for septic emboli from endocarditis, hence EDP talked to infectious disease-patient's can be admitted here or at [facility], as far as TEE can be done, they can consult remotely.  Blood cultures obtained.  Patient was started on IV vancomycin and cefepime.  Hospitalist admit for further evaluation and management.  Review of Systems: As per HPI all other systems reviewed and negative.  Past Medical History:  Diagnosis Date  . Substance abuse (HCC)     History reviewed. No pertinent surgical history.   reports that he has been smoking cigarettes. He has been smoking about 1.00 pack per day. He has never used smokeless tobacco. He reports previous alcohol use. He reports previous drug use. Drug: IV.  No Known Allergies  Family history of cancers both paternal and maternal side of the family.  Prior to Admission medications    Medication Sig Start Date End Date Taking? Authorizing Provider  gabapentin (NEURONTIN) 300 MG capsule Take 1,200 mg by mouth at bedtime. 01/23/19   [provider]  ondansetron (ZOFRAN ODT) 4 MG disintegrating tablet Take 1 tablet (4 mg total) by mouth every 8 (eight) hours as needed. 03/18/19   Vanetta Mulders, MD  SUBOXONE 8-2 MG FILM Place 1 Film under the tongue every 8 (eight) hours. 03/09/19   [provider]    Physical Exam: Vitals:   09/27/19 1300 09/27/19 1425 09/27/19 1430 09/27/19 1513  BP: 122/74 116/64 114/66 112/68  Pulse: 89 92 90 97  Resp: 12 16 15 18   Temp:  98 F (36.7 C)  97.9 F (36.6 C)  TempSrc:  Oral  Oral  SpO2: 100% 100% 100% 100%  Weight:      Height:        Constitutional: NAD, calm, comfortable Vitals:   09/27/19 1300 09/27/19 1425 09/27/19 1430 09/27/19 1513  BP: 122/74 116/64 114/66 112/68  Pulse: 89 92 90 97  Resp: 12 16 15 18   Temp:  98 F (36.7 C)  97.9 F (36.6 C)  TempSrc:  Oral  Oral  SpO2: 100% 100% 100% 100%  Weight:      Height:       Eyes: PERRL, lids and conjunctivae normal ENMT: Mucous membranes are moist.  Neck: normal, supple, no masses, no thyromegaly Respiratory: clear to auscultation bilaterally, no wheezing, no crackles. Normal respiratory effort. No accessory muscle  use.  Cardiovascular: Regular rate and rhythm, no murmurs / rubs / gallops.  Bilateral feet appear puffy R slightly > L, without significant pitting edema. 2+ pedal pulses.  Abdomen: no tenderness, no masses palpated. No hepatosplenomegaly. Bowel sounds positive.  Musculoskeletal: no clubbing / cyanosis. No joint deformity upper and lower extremities. Good ROM, no contractures. Normal muscle tone.  Skin: Multiple wounds in various stages of healing, and varying size on bilateral upper and lower extremities, dorsal surface only. No splinter hemorrhages appreciated, soles and palms spared.  Mild surrounding erythema on all wounds, with  tenderness. Neurologic: No apparent cranial nerve abnormality, moving all extremities spontaneously. Psychiatric: Normal judgment and insight. Alert and oriented x 3. Normal mood.            CBC: Recent Labs  Lab 09/27/19 1224  WBC 13.7*  NEUTROABS 10.8*  HGB 14.9  HCT 45.1  MCV 89.7  PLT 287   Basic Metabolic Panel: Recent Labs  Lab 09/27/19 1224  NA 136  K 3.8  CL 98  CO2 29  GLUCOSE 103*  BUN 10  CREATININE 0.93  CALCIUM 9.1   Liver Function Tests: Recent Labs  Lab 09/27/19 1224  AST 18  ALT 25  ALKPHOS 88  BILITOT 0.5  PROT 8.5*  ALBUMIN 3.4*    Radiological Exams on Admission: DG Ankle Complete Right  Result Date: 09/27/2019 CLINICAL DATA:  Right foot and ankle pain after closed in car door. EXAM: RIGHT ANKLE - COMPLETE 3+ VIEW COMPARISON:  None. FINDINGS: Diffuse soft tissue swelling over the ankle. Ankle mortise is normal. No evidence of acute fracture or dislocation. IMPRESSION: No acute fracture. Electronically Signed   By: Elberta Fortis M.D.   On: 09/27/2019 12:11   DG Chest Portable 1 View  Result Date: 09/27/2019 CLINICAL DATA:  Lacerations 2 arms, hands and feet. EXAM: PORTABLE CHEST 1 VIEW COMPARISON:  03/09/2015 FINDINGS: Lungs are adequately inflated and otherwise clear. Cardiomediastinal silhouette and remainder of the exam is unchanged. IMPRESSION: No active disease. Electronically Signed   By: Elberta Fortis M.D.   On: 09/27/2019 12:13   DG Foot Complete Right  Result Date: 09/27/2019 CLINICAL DATA:  Right foot and ankle pain after closed in car door. EXAM: RIGHT FOOT COMPLETE - 3+ VIEW COMPARISON:  06/19/2014 FINDINGS: Mild degenerative changes at the first MTP joint. No evidence of acute fracture or dislocation. Small well corticated bony fragment adjacent the medial aspect of the head of the first metatarsal unchanged. IMPRESSION: No acute fracture. Electronically Signed   By: Elberta Fortis M.D.   On: 09/27/2019 12:12    EKG:  None  Assessment/Plan Active Problems:   Cellulitis   IV drug user   Extremity wounds and cellulitis-likely secondary to IV drug use.  Rules out for sepsis.  T-max 99.4.  Mild leukocytosis 13.7.  Normal lactic acid 1.5 > 1.  Differentials for wound - IDVU track marks versus septic emboli (but I would have expected these lesions more on his soles and palms, but these are absent).  No cardiac murmur appreciated.  Chest x-ray, right ankle and foot x-ray unremarkable. -Follow-up blood cultures results -May need TTE and TEE pending blood culture results, and if positive ID consult -Continue IV Vanco and cefepime started in ED -BMP, CBC a.m.  IV drug use- heroin-reports to me last use was 4 days ago.  Denies other drug use.  Denies alcohol use.  Patient tells me he is ready to quit substance use. -Obtain UDS, check for  other substances of abuse -Alcohol level -Social work consult   DVT prophylaxis: Lovenox Code Status: Full code Family Communication: None at bedside Disposition Plan: >/ = 2 days.  Pending culture results. Consults called: None. Admission status: Inpatient, MedSurg I certify that at the point of admission it is my clinical judgment that the patient will require inpatient hospital care spanning beyond 2 midnights from the point of admission due to high intensity of service, high risk for further deterioration and high frequency of surveillance required. The following factors support the patient status of inpatient: Requiring IV antibiotics pending culture results.   Bethena Roys MD [facility]  09/27/2019, 4:14 PM

## 2019-09-27 NOTE — ED Triage Notes (Signed)
Patient c/o bilateral foot pain and rash on both hands for the past week. Patient states he has infection in his feet.

## 2019-09-27 NOTE — ED Notes (Signed)
Pt in bed, pt states that he was just resting, pt reports decreased pain, resps even and unlabored

## 2019-09-27 NOTE — Progress Notes (Signed)
Pharmacy Antibiotic Note  Ian Ali is a 39 y.o. male admitted on 09/27/2019 with concern for endocarditis.  Pharmacy has been consulted for Vancomycin and Cefepime dosing.  Plan: Vancomycin 2000 mg IV x 1 dose. Vancomycin 1500 mg IV every 12 hours. Predicted AUC 542 Cefepime 2000 mg IV every 8 hours. Monitor labs, c/s, and vanco levels as indicated.  Height: 6' (182.9 cm) Weight: 240 lb (108.9 kg) IBW/kg (Calculated) : 77.6  Temp (24hrs), Avg:98.4 F (36.9 C), Min:97.9 F (36.6 C), Max:99.4 F (37.4 C)  Recent Labs  Lab 09/27/19 1224 09/27/19 1225 09/27/19 1439  WBC 13.7*  --   --   CREATININE 0.93  --   --   LATICACIDVEN  --  1.5 1.0    Estimated Creatinine Clearance: 138.6 mL/min (by C-G formula based on SCr of 0.93 mg/dL).    No Known Allergies  Antimicrobials this admission: Vanco 3/24 >>  Cefepime 3/24 >>   Dose adjustments this admission: N/A  Microbiology results: 3/24 BCx: pending   Thank you for allowing pharmacy to be a part of this patient's care.  Tad Moore 09/27/2019 4:28 PM

## 2019-09-27 NOTE — ED Provider Notes (Signed)
[facility] EMERGENCY DEPARTMENT Provider Note   CSN: [id number] Arrival date & time: 09/27/19  1030     History Chief Complaint  Patient presents with  . Foot Pain    Ian Ali is a 39 y.o. male.  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  States that all of his symptoms started after he sustained a cut on his right index finger.  Following this he developed several lesions to the dorsal aspect of his bilateral hands that were initially itchy but also painful.  They have now developed into scabs.  He also developed similar lesions to his bilateral lower extremities.  Again they were initially itchy and he scratched them leading to multiple open wounds.  He also has pain to these areas now.  He denies any associated fevers, chills, chest pain, shortness of breath or pain with inspiration.  He reports that he recently developed swelling to the bilateral lower extremities as well.  Denies any calf pain.  He does note that he accidentally closed his right foot/ankle in a car door a few days ago.  He does admit to active IV drug use, he last shot heroin last week to his bilateral upper extremities.  Past Medical History:  Diagnosis Date  . Substance abuse [facility])     Patient Active Problem List   Diagnosis Date Noted  . Cellulitis 09/27/2019   History reviewed. No pertinent surgical history.   No family history on file.  Social History   Tobacco Use  . Smoking status: Current Every Day Smoker    Packs/day: 1.00    Types: Cigarettes  . Smokeless tobacco: Never Used  Substance Use Topics  . Alcohol use: Not Currently    Comment: occ  . Drug use: Not Currently    Types: IV    Comment: heroin twice a week last used a week ago    Home Medications Prior to Admission medications   Medication Sig Start Date End Date Taking? Authorizing Provider  gabapentin (NEURONTIN)  300 MG capsule Take 1,200 mg by mouth at bedtime. 01/23/19   [provider]  ondansetron (ZOFRAN ODT) 4 MG disintegrating tablet Take 1 tablet (4 mg total) by mouth every 8 (eight) hours as needed. 03/18/19   Vanetta Mulders, MD  SUBOXONE 8-2 MG FILM Place 1 Film under the tongue every 8 (eight) hours. 03/09/19   [provider]    Allergies    Patient has no known allergies.  Review of Systems   Review of Systems  Constitutional: Negative for chills and fever.  HENT: Negative for ear pain and sore throat.   Eyes: Negative for visual disturbance.  Respiratory: Negative for cough and shortness of breath.   Cardiovascular: Positive for leg swelling. Negative for chest pain.  Gastrointestinal: Negative for abdominal pain, diarrhea, nausea and vomiting.  Genitourinary: Negative for dysuria and hematuria.  Musculoskeletal:       Bilat hand/foot pain  Skin: Positive for wound.  Neurological: Negative for headaches.  All other systems reviewed and are negative.   Physical Exam Updated Vital Signs BP 112/68 (BP Location: Right Arm)   Pulse 97   Temp 97.9 F (36.6 C) (Oral)   Resp 18   Ht 6' (1.829 m)   Wt 108.9 kg   SpO2 100%   BMI 32.55 kg/m   Physical Exam Vitals and nursing note reviewed.  Constitutional:  Appearance: He is well-developed.  HENT:     Head: Normocephalic and atraumatic.  Eyes:     Conjunctiva/sclera: Conjunctivae normal.  Cardiovascular:     Rate and Rhythm: Regular rhythm. Tachycardia present.     Heart sounds: Normal heart sounds. No murmur.  Pulmonary:     Effort: Pulmonary effort is normal. No respiratory distress.     Breath sounds: Normal breath sounds. No wheezing, rhonchi or rales.  Abdominal:     General: Bowel sounds are normal.     Palpations: Abdomen is soft.     Tenderness: There is no abdominal tenderness. There is no guarding or rebound.  Musculoskeletal:     Cervical back: Neck supple.     Right lower leg: Edema  present.     Left lower leg: Edema present.  Skin:    General: Skin is warm and dry.     Comments: Multiple open wounds noted to the bilat hands and feet (pictured below). There is no involvement of the palms and soles. Additionally (not pictured), pt noted to have moist, white wounds between the toes consistent with tinea pedis.   Neurological:     Mental Status: He is alert.                 ED Results / Procedures / Treatments   Labs (all labs ordered are listed, but only abnormal results are displayed) Labs Reviewed  CBC WITH DIFFERENTIAL/PLATELET - Abnormal; Notable for the following components:      Result Value   WBC 13.7 (*)    Neutro Abs 10.8 (*)    Monocytes Absolute 1.1 (*)    Abs Immature Granulocytes 0.09 (*)    All other components within normal limits  COMPREHENSIVE METABOLIC PANEL - Abnormal; Notable for the following components:   Glucose, Bld 103 (*)    Total Protein 8.5 (*)    Albumin 3.4 (*)    All other components within normal limits  BRAIN NATRIURETIC PEPTIDE - Abnormal; Notable for the following components:   B Natriuretic Peptide 111.0 (*)    All other components within normal limits  C-REACTIVE PROTEIN - Abnormal; Notable for the following components:   CRP 7.4 (*)    All other components within normal limits  CULTURE, BLOOD (ROUTINE X 2)  CULTURE, BLOOD (ROUTINE X 2)  SARS CORONAVIRUS 2 (TAT 6-24 HRS)  LACTIC ACID, PLASMA  LACTIC ACID, PLASMA    EKG None  Radiology DG Ankle Complete Right  Result Date: 09/27/2019 CLINICAL DATA:  Right foot and ankle pain after closed in car door. EXAM: RIGHT ANKLE - COMPLETE 3+ VIEW COMPARISON:  None. FINDINGS: Diffuse soft tissue swelling over the ankle. Ankle mortise is normal. No evidence of acute fracture or dislocation. IMPRESSION: No acute fracture. Electronically Signed   By: Marin Olp M.D.   On: 09/27/2019 12:11   DG Chest Portable 1 View  Result Date: 09/27/2019 CLINICAL DATA:   Lacerations 2 arms, hands and feet. EXAM: PORTABLE CHEST 1 VIEW COMPARISON:  03/09/2015 FINDINGS: Lungs are adequately inflated and otherwise clear. Cardiomediastinal silhouette and remainder of the exam is unchanged. IMPRESSION: No active disease. Electronically Signed   By: Marin Olp M.D.   On: 09/27/2019 12:13   DG Foot Complete Right  Result Date: 09/27/2019 CLINICAL DATA:  Right foot and ankle pain after closed in car door. EXAM: RIGHT FOOT COMPLETE - 3+ VIEW COMPARISON:  06/19/2014 FINDINGS: Mild degenerative changes at the first MTP joint. No evidence of acute fracture  or dislocation. Small well corticated bony fragment adjacent the medial aspect of the head of the first metatarsal unchanged. IMPRESSION: No acute fracture. Electronically Signed   By: Elberta Fortis M.D.   On: 09/27/2019 12:12    Procedures Procedures (including critical care time)  Medications Ordered in ED Medications  vancomycin (VANCOREADY) IVPB 2000 mg/400 mL (has no administration in time range)  ceFEPIme (MAXIPIME) 2 g in sodium chloride 0.9 % 100 mL IVPB (2 g Intravenous New Bag/Given 09/27/19 1526)  sodium chloride 0.9 % bolus 1,000 mL (0 mLs Intravenous Stopped 09/27/19 1435)    ED Course  I have reviewed the triage vital signs and the nursing notes.  Pertinent labs & imaging results that were available during my care of the patient were reviewed by me and considered in my medical decision making (see chart for details).    MDM Rules/Calculators/A&P                      39 y/o M presenting to the ED today for eval of BLE swelling and multiple wounds to the hands and feet. He has a h/o IVDU.  Reviewed/interpreted labs CBC with leukocytosis to 13,000 with left shift CMP with normal electrolytes, kidney and liver function BNP is slightly elevated CRP is significantly elevated at 7.4 Lactic acid is negative Blood cultures obtained  Reviewed/interpreted all imaging Xray right ankle -without evidence  of acute traumatic injury Xray right foot -without evidence of acute traumatic injury.  No x-ray evidence of osteomyelitis CXR -is without pneumonia, pneumothorax or other acute findings.  Pts clinical picture is concerning for endocarditis or bacteremia. Feel that he would benefit from inpatient admission for further w/u including and ECHO.   Pt seen in conjunction with Dr. Jacqulyn Bath who personally evaluated the patient and is in agreement with the plan.   2:47 PM CONSULT with Dr. Daiva Eves with infectious disease who recommends starting vancomycin and cefepime. He states that as long as patient can undergo TEE at [facility] then he can be admitted here and he can consult remotely.   3:26 PM CONSULT with Dr. Mariea Clonts who accepts patient for admission.    Final Clinical Impression(s) / ED Diagnoses Final diagnoses:  None    Rx / DC Orders ED Discharge Orders    None       Rayne Du 09/27/19 1529    Long, Arlyss Repress, MD 10/02/19 0800

## 2019-09-27 NOTE — ED Notes (Signed)
Pt sitting in chair, pt c/o wounds to his hands arms and feet, pt states that they started after cutting his hand on a grill.  Pt has multiple wounds in various stages of healing, some with yellowish exudate, and redness surrounding.  Pt also c/o similar wounds on his feet and some bilateral foot swelling.

## 2019-09-28 DIAGNOSIS — F1990 Other psychoactive substance use, unspecified, uncomplicated: Secondary | ICD-10-CM

## 2019-09-28 LAB — BASIC METABOLIC PANEL
Anion gap: 9 (ref 5–15)
BUN: 11 mg/dL (ref 6–20)
CO2: 29 mmol/L (ref 22–32)
Calcium: 8.9 mg/dL (ref 8.9–10.3)
Chloride: 100 mmol/L (ref 98–111)
Creatinine, Ser: 0.82 mg/dL (ref 0.61–1.24)
GFR calc Af Amer: >60 mL/min (ref >60)
GFR calc non Af Amer: >60 mL/min (ref >60)
Glucose, Bld: 99 mg/dL (ref 70–99)
Potassium: 4 mmol/L (ref 3.5–5.1)
Sodium: 138 mmol/L (ref 135–145)

## 2019-09-28 LAB — HEPATITIS C ANTIBODY: HCV Ab: NONREACTIVE

## 2019-09-28 LAB — CBC
HCT: 41.8 % (ref 39.0–52.0)
Hemoglobin: 13.6 g/dL (ref 13.0–17.0)
MCH: 29.1 pg (ref 26.0–34.0)
MCHC: 32.5 g/dL (ref 30.0–36.0)
MCV: 89.3 fL (ref 80.0–100.0)
Platelets: 265 10*3/uL (ref 150–400)
RBC: 4.68 MIL/uL (ref 4.22–5.81)
RDW: 13 % (ref 11.5–15.5)
WBC: 10.4 10*3/uL (ref 4.0–10.5)
nRBC: 0 % (ref 0.0–0.2)

## 2019-09-28 LAB — CRYPTOCOCCAL ANTIGEN: Crypto Ag: NEGATIVE

## 2019-09-28 LAB — HEPATITIS B SURFACE ANTIGEN: Hepatitis B Surface Ag: NONREACTIVE

## 2019-09-28 LAB — HIV ANTIBODY (ROUTINE TESTING W REFLEX): HIV Screen 4th Generation wRfx: NONREACTIVE

## 2019-09-28 MED ORDER — ENOXAPARIN SODIUM 60 MG/0.6ML ~~LOC~~ SOLN
50.0000 mg | SUBCUTANEOUS | Status: DC
  Filled 2019-09-28: qty 0.6

## 2019-09-28 NOTE — Progress Notes (Signed)
      INFECTIOUS DISEASE ATTENDING ADDENDUM:   Date: 09/28/2019  Patient name: Ian Ali  Medical record number: [id number]  Date of birth: 10-06-1980   39 year old with IVDU admitted with bilateral foot pain, rash on hands and feet. ER staff were concerned for infectious endocarditis, blood cultures obtained. I recommended vancomycin, cefepime, TTE and then TEE if unrevealing  So far blood cultures unrevealing. Lesions are not on soles and hands per primary team  They are pictured below:          I am not sure what these lesions represent. Failed injection sites. Rash that has been excoriated. There seems to be some lymphangitic nature to some of them. He also has it appears sig LE edema though.  I would definitely get TTE and consider TEE.   HIV RNA is pending. HIV ab, HBV sag, HCV ab negative  Will order cryoglobulins, RPR ANCA ESR, CRP  Could he be using cocaine and this represent levimasole toxicity it does not appear typical for that  Will order serum cryptococcus, urine histo and blastomyces antigen       Rhina Brackett Dam 09/28/2019, 1:45 PM

## 2019-09-28 NOTE — Progress Notes (Signed)
PROGRESS NOTE    Ian Ali  OQH:[id number] DOB: 19-Aug-1980 DOA: 09/27/2019 PCP: Patient, No Pcp Per   Brief Narrative:  Per HPI: Ian Ali is a 39 y.o. male with medical history significant for IVDU.  Patient presented to the ED with complaints of bilateral foot pain and rash on both hands and lower extremities over the past week.  Reports some swelling involving his feet only.  He denies fevers or chills, no joint pain, no vomiting, no cough or difficulty breathing, no vision change.  He reports he last used heroin was 4 days ago, injected into bilateral upper extremities, he also sometimes snorts it.  He denies alcohol use, denies any other illicit drug use.  He is not on medication and he has no other medical problems.  3/25: Patient was admitted with extremity wounds/cellulitis in the setting of IV drug abuse.  Discussed case with ID and Dr. [place] Medal who has ordered further blood studies.  Hepatitis and HIV screening negative.  No concern for endocarditis currently noted on clinical exam.  We will continue to follow blood cultures on IV vancomycin and cefepime and consider for discharge in a.m.  Assessment & Plan:   Active Problems:   Cellulitis   IV drug user   Extremity wounds and cellulitis in the setting of IVDU with heroin use -Current blood culture results negative -HIV and hepatitis screening negative -Cryoglobulins and RPR ordered per ID recommendations-appreciated -Hold off on echocardiogram for now -Continue IV vancomycin and cefepime and monitor for fevers -Repeat labs in a.m.  IV drug abuse with heroin -Alcohol level negative -Appreciate CSW support with resources offered   DVT prophylaxis: Lovenox Code Status: Full Family Communication: None at bedside Disposition Plan: Per ID recommendations.  Continue on IV antibiotics and monitor blood cultures for now.  Anticipate discharge in a.m. if stable and improved.   Consultants:   ID-Dr. [place] Medal  Procedures:    See below  Antimicrobials:  Anti-infectives (From admission, onward)   Start     Dose/Rate Route Frequency Ordered Stop   09/28/19 0500  vancomycin (VANCOREADY) IVPB 1500 mg/300 mL     1,500 mg 150 mL/hr over 120 Minutes Intravenous Every 12 hours 09/27/19 1628     09/27/19 1530  vancomycin (VANCOREADY) IVPB 2000 mg/400 mL     2,000 mg 200 mL/hr over 120 Minutes Intravenous  Once 09/27/19 1507 09/27/19 1808   09/27/19 1530  ceFEPIme (MAXIPIME) 2 g in sodium chloride 0.9 % 100 mL IVPB     2 g 200 mL/hr over 30 Minutes Intravenous Every 8 hours 09/27/19 1510         Subjective: Patient seen and evaluated today with no new acute complaints or concerns. No acute concerns or events noted overnight.  Objective: Vitals:   09/27/19 2151 09/28/19 0225 09/28/19 0558 09/28/19 0839  BP: (!) 107/44 115/75 100/69   Pulse: 75 86 64   Resp: 16 16 18    Temp: 97.9 F (36.6 C) 97.9 F (36.6 C) 98 F (36.7 C)   TempSrc: Oral Oral    SpO2: 100% 99% 99% 98%  Weight:      Height:        Intake/Output Summary (Last 24 hours) at 09/28/2019 1159 Last data filed at 09/28/2019 0600 Gross per 24 hour  Intake 532.48 ml  Output --  Net 532.48 ml   Filed Weights   09/27/19 1058  Weight: 108.9 kg    Examination:  General exam: Appears calm and comfortable  Respiratory system: Clear to auscultation. Respiratory effort normal. Cardiovascular system: S1 & S2 heard, RRR. No JVD, murmurs, rubs, gallops or clicks. No pedal edema. Gastrointestinal system: Abdomen is nondistended, soft and nontender. No organomegaly or masses felt. Normal bowel sounds heard. Central nervous system: Alert and oriented. No focal neurological deficits. Extremities: Symmetric 5 x 5 power. Skin: Rashes as noted on prior documentation with images.  Noted on dorsum of hands and feet bilaterally with no findings on palms or soles.  No splinter hemorrhages. Psychiatry: Judgement and insight appear normal. Mood & affect  appropriate.     Data Reviewed: I have personally reviewed following labs and imaging studies  CBC: Recent Labs  Lab 09/27/19 1224 09/28/19 0421  WBC 13.7* 10.4  NEUTROABS 10.8*  --   HGB 14.9 13.6  HCT 45.1 41.8  MCV 89.7 89.3  PLT 287 265   Basic Metabolic Panel: Recent Labs  Lab 09/27/19 1224 09/28/19 0421  NA 136 138  K 3.8 4.0  CL 98 100  CO2 29 29  GLUCOSE 103* 99  BUN 10 11  CREATININE 0.93 0.82  CALCIUM 9.1 8.9   GFR: Estimated Creatinine Clearance: 157.2 mL/min (by C-G formula based on SCr of 0.82 mg/dL). Liver Function Tests: Recent Labs  Lab 09/27/19 1224  AST 18  ALT 25  ALKPHOS 88  BILITOT 0.5  PROT 8.5*  ALBUMIN 3.4*   No results for input(s): LIPASE, AMYLASE in the last 168 hours. No results for input(s): AMMONIA in the last 168 hours. Coagulation Profile: No results for input(s): INR, PROTIME in the last 168 hours. Cardiac Enzymes: No results for input(s): CKTOTAL, CKMB, CKMBINDEX, TROPONINI in the last 168 hours. BNP (last 3 results) No results for input(s): PROBNP in the last 8760 hours. HbA1C: No results for input(s): HGBA1C in the last 72 hours. CBG: No results for input(s): GLUCAP in the last 168 hours. Lipid Profile: No results for input(s): CHOL, HDL, LDLCALC, TRIG, CHOLHDL, LDLDIRECT in the last 72 hours. Thyroid Function Tests: No results for input(s): TSH, T4TOTAL, FREET4, T3FREE, THYROIDAB in the last 72 hours. Anemia Panel: No results for input(s): VITAMINB12, FOLATE, FERRITIN, TIBC, IRON, RETICCTPCT in the last 72 hours. Sepsis Labs: Recent Labs  Lab 09/27/19 1225 09/27/19 1439  LATICACIDVEN 1.5 1.0    Recent Results (from the past 240 hour(s))  Blood culture (routine x 2)     Status: None (Preliminary result)   Collection Time: 09/27/19 12:24 PM   Specimen: Right Antecubital; Blood  Result Value Ref Range Status   Specimen Description   Final    RIGHT ANTECUBITAL BOTTLES DRAWN AEROBIC AND ANAEROBIC   Special  Requests Blood Culture adequate volume  Final   Culture   Final    NO GROWTH < 24 HOURS Performed at [facility], [address] [place], [place] [address]    Report Status PENDING  Incomplete  Blood culture (routine x 2)     Status: None (Preliminary result)   Collection Time: 09/27/19 12:25 PM   Specimen: Left Antecubital; Blood  Result Value Ref Range Status   Specimen Description   Final    LEFT ANTECUBITAL BOTTLES DRAWN AEROBIC AND ANAEROBIC   Special Requests Blood Culture adequate volume  Final   Culture   Final    NO GROWTH < 24 HOURS Performed at [facility], [address] [place], [place] [address]    Report Status PENDING  Incomplete  SARS CORONAVIRUS 2 (TAT 6-24 HRS) Nasopharyngeal Nasopharyngeal Swab  Status: None   Collection Time: 09/27/19  3:36 PM   Specimen: Nasopharyngeal Swab  Result Value Ref Range Status   SARS Coronavirus 2 NEGATIVE NEGATIVE Final    Comment: (NOTE) SARS-CoV-2 target nucleic acids are NOT DETECTED. The SARS-CoV-2 RNA is generally detectable in upper and lower respiratory specimens during the acute phase of infection. Negative results do not preclude SARS-CoV-2 infection, do not rule out co-infections with other pathogens, and should not be used as the sole basis for treatment or other patient management decisions. Negative results must be combined with clinical observations, patient history, and epidemiological information. The expected result is Negative. Fact Sheet for Patients: HairSlick.no Fact Sheet for Healthcare Providers: quierodirigir.com This test is not yet approved or cleared by the [place] FDA and  has been authorized for detection and/or diagnosis of SARS-CoV-2 by FDA under an Emergency Use Authorization (EUA). This EUA will remain  in effect (meaning this test can be used) for the duration of the COVID-19 declaration under Section 56 4(b)(1) of the Act, 21  U.S.C. section 360bbb-3(b)(1), unless the authorization is terminated or revoked sooner. Performed at [facility], [address], [place], [place] [address]          Radiology Studies: DG Ankle Complete Right  Result Date: 09/27/2019 CLINICAL DATA:  Right foot and ankle pain after closed in car door. EXAM: RIGHT ANKLE - COMPLETE 3+ VIEW COMPARISON:  None. FINDINGS: Diffuse soft tissue swelling over the ankle. Ankle mortise is normal. No evidence of acute fracture or dislocation. IMPRESSION: No acute fracture. Electronically Signed   By: Elberta Fortis M.D.   On: 09/27/2019 12:11   DG Chest Portable 1 View  Result Date: 09/27/2019 CLINICAL DATA:  Lacerations 2 arms, hands and feet. EXAM: PORTABLE CHEST 1 VIEW COMPARISON:  03/09/2015 FINDINGS: Lungs are adequately inflated and otherwise clear. Cardiomediastinal silhouette and remainder of the exam is unchanged. IMPRESSION: No active disease. Electronically Signed   By: Elberta Fortis M.D.   On: 09/27/2019 12:13   DG Foot Complete Right  Result Date: 09/27/2019 CLINICAL DATA:  Right foot and ankle pain after closed in car door. EXAM: RIGHT FOOT COMPLETE - 3+ VIEW COMPARISON:  06/19/2014 FINDINGS: Mild degenerative changes at the first MTP joint. No evidence of acute fracture or dislocation. Small well corticated bony fragment adjacent the medial aspect of the head of the first metatarsal unchanged. IMPRESSION: No acute fracture. Electronically Signed   By: Elberta Fortis M.D.   On: 09/27/2019 12:12        Scheduled Meds: . enoxaparin (LOVENOX) injection  50 mg Subcutaneous Q24H   Continuous Infusions: . ceFEPime (MAXIPIME) IV 2 g (09/28/19 0852)  . vancomycin 1,500 mg (09/28/19 0557)     LOS: 1 day    Time spent: 35 minutes    Ethridge Sollenberger D Sherryll Burger, DO [facility]  If 7PM-7AM, please contact night-coverage [email or web address] 09/28/2019, 11:59 AM

## 2019-09-29 LAB — BASIC METABOLIC PANEL
Anion gap: 10 (ref 5–15)
BUN: 14 mg/dL (ref 6–20)
CO2: 29 mmol/L (ref 22–32)
Calcium: 9.2 mg/dL (ref 8.9–10.3)
Chloride: 101 mmol/L (ref 98–111)
Creatinine, Ser: 0.9 mg/dL (ref 0.61–1.24)
GFR calc Af Amer: >60 mL/min (ref >60)
GFR calc non Af Amer: >60 mL/min (ref >60)
Glucose, Bld: 96 mg/dL (ref 70–99)
Potassium: 4 mmol/L (ref 3.5–5.1)
Sodium: 140 mmol/L (ref 135–145)

## 2019-09-29 LAB — HIV-1 RNA QUANT-NO REFLEX-BLD
HIV 1 RNA Quant: <20 copies/mL
LOG10 HIV-1 RNA: UNDETERMINED log10copy/mL

## 2019-09-29 LAB — CBC
HCT: 42 % (ref 39.0–52.0)
Hemoglobin: 13.3 g/dL (ref 13.0–17.0)
MCH: 28.5 pg (ref 26.0–34.0)
MCHC: 31.7 g/dL (ref 30.0–36.0)
MCV: 90.1 fL (ref 80.0–100.0)
Platelets: 291 10*3/uL (ref 150–400)
RBC: 4.66 MIL/uL (ref 4.22–5.81)
RDW: 12.9 % (ref 11.5–15.5)
WBC: 8.2 10*3/uL (ref 4.0–10.5)
nRBC: 0 % (ref 0.0–0.2)

## 2019-09-29 LAB — RPR: RPR Ser Ql: NONREACTIVE

## 2019-09-29 LAB — HEPATITIS B SURFACE ANTIBODY, QUANTITATIVE: Hep B S AB Quant (Post): <3.1 m[IU]/mL — ABNORMAL LOW (ref >9.9)

## 2019-09-29 MED ORDER — DOXYCYCLINE HYCLATE 100 MG PO CAPS: 100.0000 mg | ORAL_CAPSULE | Freq: Two times a day (BID) | ORAL | 0 refills | Status: AC

## 2019-09-29 NOTE — TOC Progression Note (Signed)
Transition of Care [facility]) - Progression Note    Patient Details  Name: Ian Ali MRN: [id number] Date of Birth: 06-Mar-1981  Transition of [facility] CM/SW Contact  Leitha Bleak, RN Phone Number: 09/29/2019, 10:39 AM  Clinical Narrative:   Patient admitted for cellulitis, discharging home. Patient has no PCP, list of doctors provided.    Expected Discharge Plan and Services        Expected Discharge Date: 09/29/19

## 2019-09-29 NOTE — Discharge Summary (Signed)
Physician Discharge Summary  Ian Ali ZOX:[id number] DOB: 10-14-1980 DOA: 09/27/2019  PCP: Patient, No Pcp Per  Admit date: 09/27/2019  Discharge date: 09/29/2019  Admitted From:Home  Disposition:  Home  Recommendations for Outpatient Follow-up:  1. Follow up with PCP in 1-2 weeks; list to be given on dc 2. Follow-up with [facility] as recommended with counseling given on stopping IV drug use 3. Continue on doxycycline 100 mg twice daily as prescribed for 2 more days to complete course of treatment for cellulitis  Home Health: None  Equipment/Devices: None  Discharge Condition: Stable  CODE STATUS: Full  Diet recommendation: Regular  Brief/Interim Summary: Per HPI: Ian Ali a 38 y.o.malewith medical history significant forIVDU.Patient presented to the ED with complaints of bilateral foot pain and rash on both hands and lower extremities over the past week. Reports some swelling involving his feet only. He denies fevers or chills, no joint pain, no vomiting, no cough or difficulty breathing,no vision change.  He reports he last used heroin was 4 days ago, injected into bilateral upper extremities,he also sometimessnortsit. He denies alcohol use, denies any other illicitdrug use. He is not on medication and he has no other medical problems.  3/25: Patient was admitted with extremity wounds/cellulitis in the setting of IV drug abuse.  Discussed case with ID and Dr. Daiva Eves who has ordered further blood studies.  Hepatitis and HIV screening negative.  No concern for endocarditis currently noted on clinical exam.  We will continue to follow blood cultures on IV vancomycin and cefepime and consider for discharge in a.m.  3/26: Patient states that rashes have improved on his feet and hands, and they indeed look improved this morning.  He has had no further fevers and blood cultures have remained negative.  Discussed case with ID who had  initially recommended echocardiogram, but does not feel that it is absolutely necessary to perform the study since there are no clinical signs of endocarditis and since blood cultures have remained negative.  Patient is eager to go home and he appears stable for discharge.  He will remain on doxycycline for 10 more days as prescribed to complete course of treatment for cellulitis.  Further infectious disease studies with hepatitis panel and HIV screening has returned negative.  He will be given a list of PCPs to follow-up with in the outpatient setting and has been encouraged to do so at discharge.  He has also been encouraged to go to substance abuse counseling.  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  Discharge Instructions  Discharge Instructions    Diet - low sodium heart healthy   Complete by: As directed    Increase activity slowly   Complete by: As directed      Allergies as of 09/29/2019   No Known Allergies     Medication List    TAKE these medications   doxycycline 100 MG capsule Commonly known as: VIBRAMYCIN Take 1 capsule (100 mg total) by mouth 2 (two) times daily for 10 days.   ondansetron 4 MG disintegrating tablet Commonly known as: Zofran ODT Take 1 tablet (4 mg total) by mouth every 8 (eight) hours as needed.      Follow-up Information    [facility] Follow up.   Why: Accepts Medicaid. Located at [address] [place], [place] [address]. Call [phone] to schedule an appointment.       pcp Follow up in 2 week(s).          No Known Allergies  Consultations:  ID-remotely   Procedures/Studies: DG Ankle Complete Right  Result Date: 09/27/2019 CLINICAL DATA:  Right foot and ankle pain after closed in car door. EXAM: RIGHT ANKLE - COMPLETE 3+ VIEW COMPARISON:  None. FINDINGS: Diffuse  soft tissue swelling over the ankle. Ankle mortise is normal. No evidence of acute fracture or dislocation. IMPRESSION: No acute fracture. Electronically Signed   By: Elberta Fortis M.D.   On: 09/27/2019 12:11   DG Chest Portable 1 View  Result Date: 09/27/2019 CLINICAL DATA:  Lacerations 2 arms, hands and feet. EXAM: PORTABLE CHEST 1 VIEW COMPARISON:  03/09/2015 FINDINGS: Lungs are adequately inflated and otherwise clear. Cardiomediastinal silhouette and remainder of the exam is unchanged. IMPRESSION: No active disease. Electronically Signed   By: Elberta Fortis M.D.   On: 09/27/2019 12:13   DG Foot Complete Right  Result Date: 09/27/2019 CLINICAL DATA:  Right foot and ankle pain after closed in car door. EXAM: RIGHT FOOT COMPLETE - 3+ VIEW COMPARISON:  06/19/2014 FINDINGS: Mild degenerative changes at the first MTP joint. No evidence of acute fracture or dislocation. Small well corticated bony fragment adjacent the medial aspect of the head of the first metatarsal unchanged. IMPRESSION: No acute fracture. Electronically Signed   By: Elberta Fortis M.D.   On: 09/27/2019 12:12     Discharge Exam: Vitals:   09/29/19 0546 09/29/19 0829  BP: 114/62   Pulse: 83   Resp: 15   Temp: 97.7 F (36.5 C)   SpO2: 98% 96%   Vitals:   09/28/19 1408 09/28/19 2043 09/29/19 0546 09/29/19 0829  BP: 118/63 (!) 122/52 114/62   Pulse: 74 84 83   Resp: 18 17 15    Temp: 97.6 F (36.4 C) 98 F (36.7 C) 97.7 F (36.5 C)   TempSrc:  Oral Oral   SpO2: 98% 97% 98% 96%  Weight:      Height:        General: Pt is alert, awake, not in acute distress Cardiovascular: RRR, S1/S2 +, no rubs, no gallops Respiratory: CTA bilaterally, no wheezing, no rhonchi Abdominal: Soft, NT, ND, bowel sounds + Extremities: no edema, no cyanosis, cellulitis and wounds on dorsum of hands and feet bilaterally appear improved.    The results of significant diagnostics from this hospitalization (including imaging, microbiology,  ancillary and laboratory) are listed below for reference.     Microbiology: Recent Results (from the past 240 hour(s))  Blood culture (routine x 2)     Status: None (Preliminary result)   Collection Time: 09/27/19 12:24 PM   Specimen: Right Antecubital; Blood  Result Value Ref Range Status   Specimen Description   Final    RIGHT ANTECUBITAL BOTTLES DRAWN AEROBIC AND ANAEROBIC   Special Requests Blood Culture adequate volume  Final   Culture   Final    NO GROWTH 2 DAYS Performed at [facility], [address], [place], [place]    Report Status PENDING  Incomplete  Blood culture (routine x 2)     Status: None (Preliminary result)   Collection Time: 09/27/19 12:25 PM   Specimen: Left Antecubital; Blood  Result Value Ref Range Status   Specimen Description   Final    LEFT ANTECUBITAL BOTTLES DRAWN AEROBIC AND ANAEROBIC   Special Requests Blood Culture adequate volume  Final  Culture   Final    NO GROWTH 2 DAYS Performed at [facility], [address], [place], [place] [address]    Report Status PENDING  Incomplete  SARS CORONAVIRUS 2 (TAT 6-24 HRS) Nasopharyngeal Nasopharyngeal Swab     Status: None   Collection Time: 09/27/19  3:36 PM   Specimen: Nasopharyngeal Swab  Result Value Ref Range Status   SARS Coronavirus 2 NEGATIVE NEGATIVE Final    Comment: (NOTE) SARS-CoV-2 target nucleic acids are NOT DETECTED. The SARS-CoV-2 RNA is generally detectable in upper and lower respiratory specimens during the acute phase of infection. Negative results do not preclude SARS-CoV-2 infection, do not rule out co-infections with other pathogens, and should not be used as the sole basis for treatment or other patient management decisions. Negative results must be combined with clinical observations, patient history, and epidemiological information. The expected result is Negative. Fact Sheet for Patients: SugarRoll.be Fact Sheet for Healthcare  Providers: https://www.woods-mathews.com/ This test is not yet approved or cleared by the [place] FDA and  has been authorized for detection and/or diagnosis of SARS-CoV-2 by FDA under an Emergency Use Authorization (EUA). This EUA will remain  in effect (meaning this test can be used) for the duration of the COVID-19 declaration under Section 56 4(b)(1) of the Act, 21 U.S.C. section 360bbb-3(b)(1), unless the authorization is terminated or revoked sooner. Performed at [facility], [place] [address], [place], [place] [address]      Labs: BNP (last 3 results) Recent Labs    09/27/19 1224  BNP 778.2*   Basic Metabolic Panel: Recent Labs  Lab 09/27/19 1224 09/28/19 0421 09/29/19 0432  NA 136 138 140  K 3.8 4.0 4.0  CL 98 100 101  CO2 29 29 29   GLUCOSE 103* 99 96  BUN 10 11 14   CREATININE 0.93 0.82 0.90  CALCIUM 9.1 8.9 9.2   Liver Function Tests: Recent Labs  Lab 09/27/19 1224  AST 18  ALT 25  ALKPHOS 88  BILITOT 0.5  PROT 8.5*  ALBUMIN 3.4*   No results for input(s): LIPASE, AMYLASE in the last 168 hours. No results for input(s): AMMONIA in the last 168 hours. CBC: Recent Labs  Lab 09/27/19 1224 09/28/19 0421 09/29/19 0432  WBC 13.7* 10.4 8.2  NEUTROABS 10.8*  --   --   HGB 14.9 13.6 13.3  HCT 45.1 41.8 42.0  MCV 89.7 89.3 90.1  PLT 287 265 291   Cardiac Enzymes: No results for input(s): CKTOTAL, CKMB, CKMBINDEX, TROPONINI in the last 168 hours. BNP: Invalid input(s): POCBNP CBG: No results for input(s): GLUCAP in the last 168 hours. D-Dimer No results for input(s): DDIMER in the last 72 hours. Hgb A1c No results for input(s): HGBA1C in the last 72 hours. Lipid Profile No results for input(s): CHOL, HDL, LDLCALC, TRIG, CHOLHDL, LDLDIRECT in the last 72 hours. Thyroid function studies No results for input(s): TSH, T4TOTAL, T3FREE, THYROIDAB in the last 72 hours.  Invalid input(s): FREET3 Anemia work up No results for  input(s): VITAMINB12, FOLATE, FERRITIN, TIBC, IRON, RETICCTPCT in the last 72 hours. Urinalysis    Component Value Date/Time   COLORURINE YELLOW 03/18/2019 1411   APPEARANCEUR CLEAR 03/18/2019 1411   LABSPEC 1.022 03/18/2019 1411   PHURINE 6.0 03/18/2019 1411   GLUCOSEU NEGATIVE 03/18/2019 1411   HGBUR MODERATE (A) 03/18/2019 1411   BILIRUBINUR NEGATIVE 03/18/2019 1411   KETONESUR NEGATIVE 03/18/2019 1411   PROTEINUR >=300 (A) 03/18/2019 1411   UROBILINOGEN 0.2 05/19/2015 1036  NITRITE NEGATIVE 03/18/2019 1411   LEUKOCYTESUR NEGATIVE 03/18/2019 1411   Sepsis Labs Invalid input(s): PROCALCITONIN,  WBC,  LACTICIDVEN Microbiology Recent Results (from the past 240 hour(s))  Blood culture (routine x 2)     Status: None (Preliminary result)   Collection Time: 09/27/19 12:24 PM   Specimen: Right Antecubital; Blood  Result Value Ref Range Status   Specimen Description   Final    RIGHT ANTECUBITAL BOTTLES DRAWN AEROBIC AND ANAEROBIC   Special Requests Blood Culture adequate volume  Final   Culture   Final    NO GROWTH 2 DAYS Performed at [facility], [address] [place], [place] [address]    Report Status PENDING  Incomplete  Blood culture (routine x 2)     Status: None (Preliminary result)   Collection Time: 09/27/19 12:25 PM   Specimen: Left Antecubital; Blood  Result Value Ref Range Status   Specimen Description   Final    LEFT ANTECUBITAL BOTTLES DRAWN AEROBIC AND ANAEROBIC   Special Requests Blood Culture adequate volume  Final   Culture   Final    NO GROWTH 2 DAYS Performed at [facility], [address], [place], [place] [address]    Report Status PENDING  Incomplete  SARS CORONAVIRUS 2 (TAT 6-24 HRS) Nasopharyngeal Nasopharyngeal Swab     Status: None   Collection Time: 09/27/19  3:36 PM   Specimen: Nasopharyngeal Swab  Result Value Ref Range Status   SARS Coronavirus 2 NEGATIVE NEGATIVE Final    Comment: (NOTE) SARS-CoV-2 target nucleic acids are NOT  DETECTED. The SARS-CoV-2 RNA is generally detectable in upper and lower respiratory specimens during the acute phase of infection. Negative results do not preclude SARS-CoV-2 infection, do not rule out co-infections with other pathogens, and should not be used as the sole basis for treatment or other patient management decisions. Negative results must be combined with clinical observations, patient history, and epidemiological information. The expected result is Negative. Fact Sheet for Patients: HairSlick.no Fact Sheet for Healthcare Providers: quierodirigir.com This test is not yet approved or cleared by the [place] FDA and  has been authorized for detection and/or diagnosis of SARS-CoV-2 by FDA under an Emergency Use Authorization (EUA). This EUA will remain  in effect (meaning this test can be used) for the duration of the COVID-19 declaration under Section 56 4(b)(1) of the Act, 21 U.S.C. section 360bbb-3(b)(1), unless the authorization is terminated or revoked sooner. Performed at [facility], [address] [place], [place] [address]      Time coordinating discharge: 35 minutes  SIGNED:   Erick Blinks, DO [facility] 09/29/2019, 9:38 AM  If 7PM-7AM, please contact night-coverage [email or web address]

## 2019-10-01 LAB — MPO/PR-3 (ANCA) ANTIBODIES
ANCA Proteinase 3: <3.5 U/mL (ref 0.0–3.5)
Myeloperoxidase Abs: <9 U/mL (ref 0.0–9.0)

## 2019-10-02 LAB — CULTURE, BLOOD (ROUTINE X 2)
Culture: NO GROWTH
Culture: NO GROWTH
Special Requests: ADEQUATE
Special Requests: ADEQUATE

## 2019-10-02 LAB — CRYOGLOBULIN

## 2019-10-04 LAB — BLASTOMYCES ANTIGEN: Blastomyces Antigen: NOT DETECTED ng/mL

## 2019-12-24 DIAGNOSIS — H9203 Otalgia, bilateral: Secondary | ICD-10-CM | POA: Diagnosis not present

## 2019-12-24 DIAGNOSIS — R42 Dizziness and giddiness: Secondary | ICD-10-CM | POA: Diagnosis not present

## 2020-10-05 DIAGNOSIS — R600 Localized edema: Secondary | ICD-10-CM | POA: Diagnosis not present

## 2020-11-11 ENCOUNTER — Telehealth: Payer: Self-pay

## 2020-11-11 NOTE — Telephone Encounter (Signed)
Transition Care Management Unsuccessful Follow-up Telephone Call  Date of discharge and from where:  11/10/2020 from [facility]  Attempts:  1st Attempt  Reason for unsuccessful TCM follow-up call:  Left voice message

## 2020-11-12 NOTE — Telephone Encounter (Signed)
Transition Care Management Unsuccessful Follow-up Telephone Call  Date of discharge and from where: 11/10/2020 from [facility]  Attempts:  2nd Attempt  Reason for unsuccessful TCM follow-up call:  Left voice message

## 2020-11-13 NOTE — Telephone Encounter (Signed)
Transition Care Management Unsuccessful Follow-up Telephone Call  Date of discharge and from where:  11/10/2020 from [facility]  Attempts:  3rd Attempt  Reason for unsuccessful TCM follow-up call:  Unable to reach patient

## 2020-12-17 DIAGNOSIS — M2011 Hallux valgus (acquired), right foot: Secondary | ICD-10-CM | POA: Diagnosis not present

## 2020-12-17 DIAGNOSIS — M1000 Idiopathic gout, unspecified site: Secondary | ICD-10-CM | POA: Diagnosis not present

## 2021-11-15 IMAGING — DX DG FOOT COMPLETE 3+V*R*
3 series · 3 of 3 positions shown · non-contrast
Comparison: 06/19/2014

CLINICAL DATA: Right foot and ankle pain after closed in car door.

EXAM:
RIGHT FOOT COMPLETE - 3+ VIEW

[foot ap]
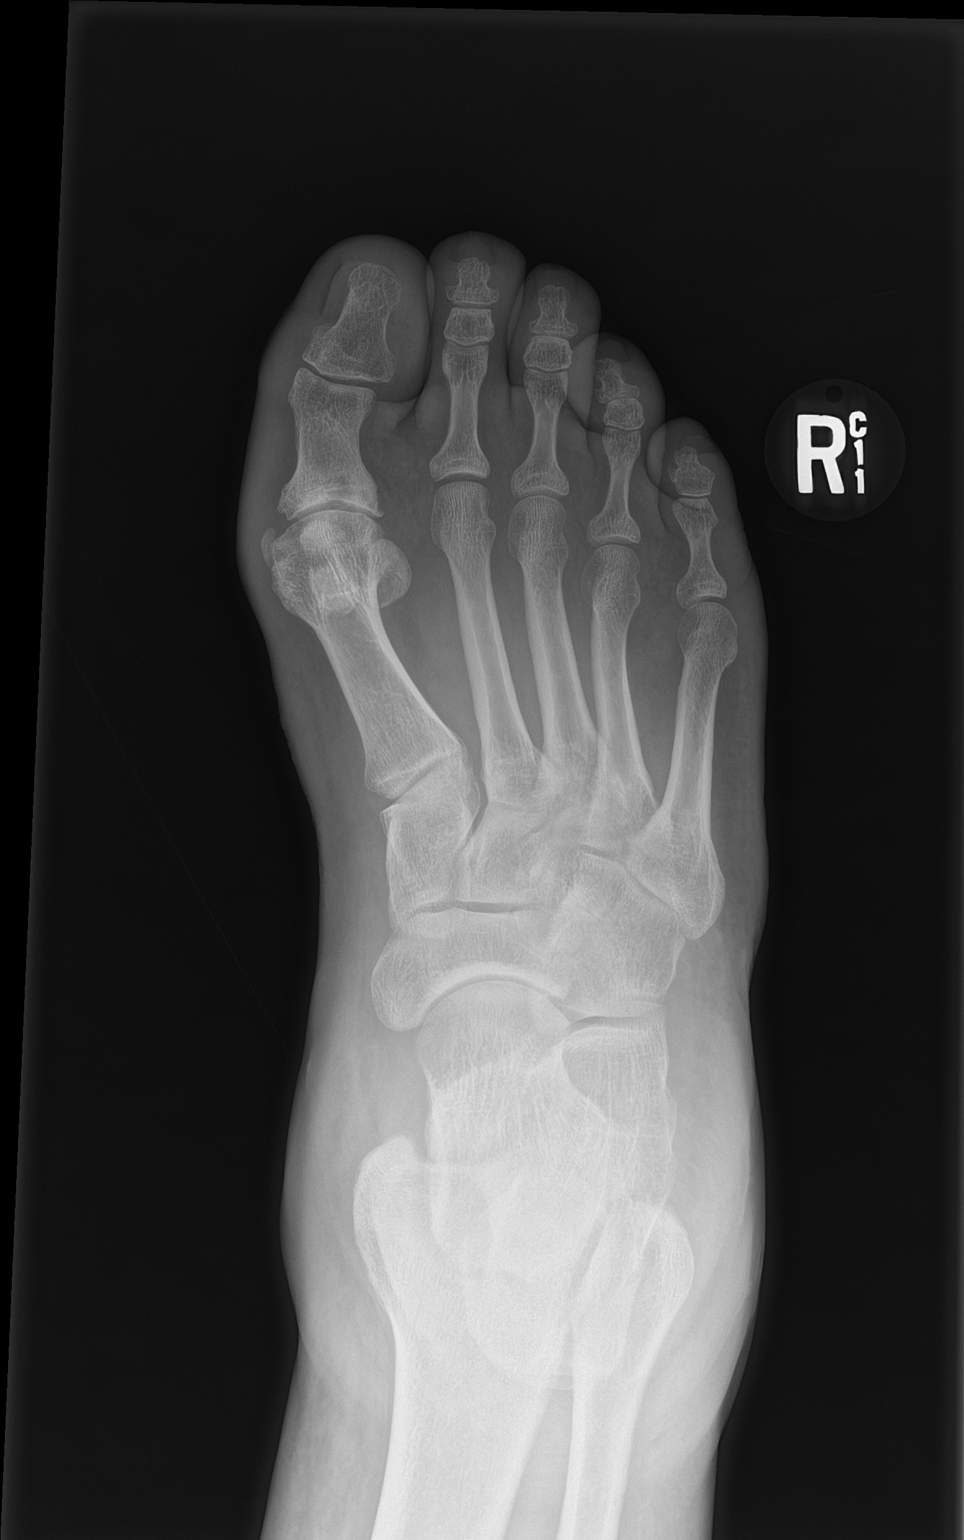

[foot obl]
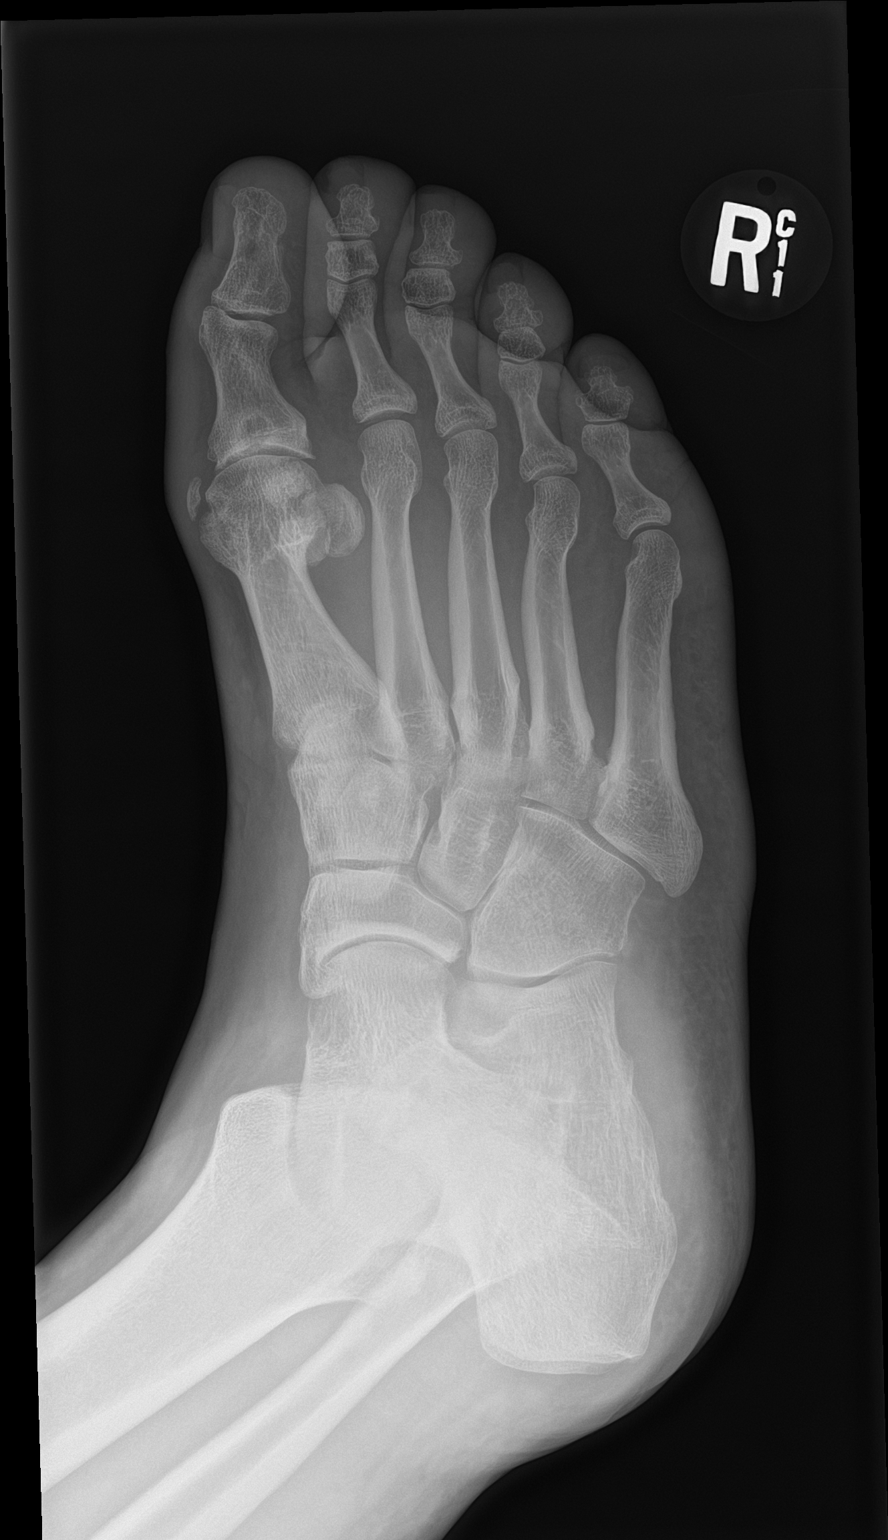

[foot lat]
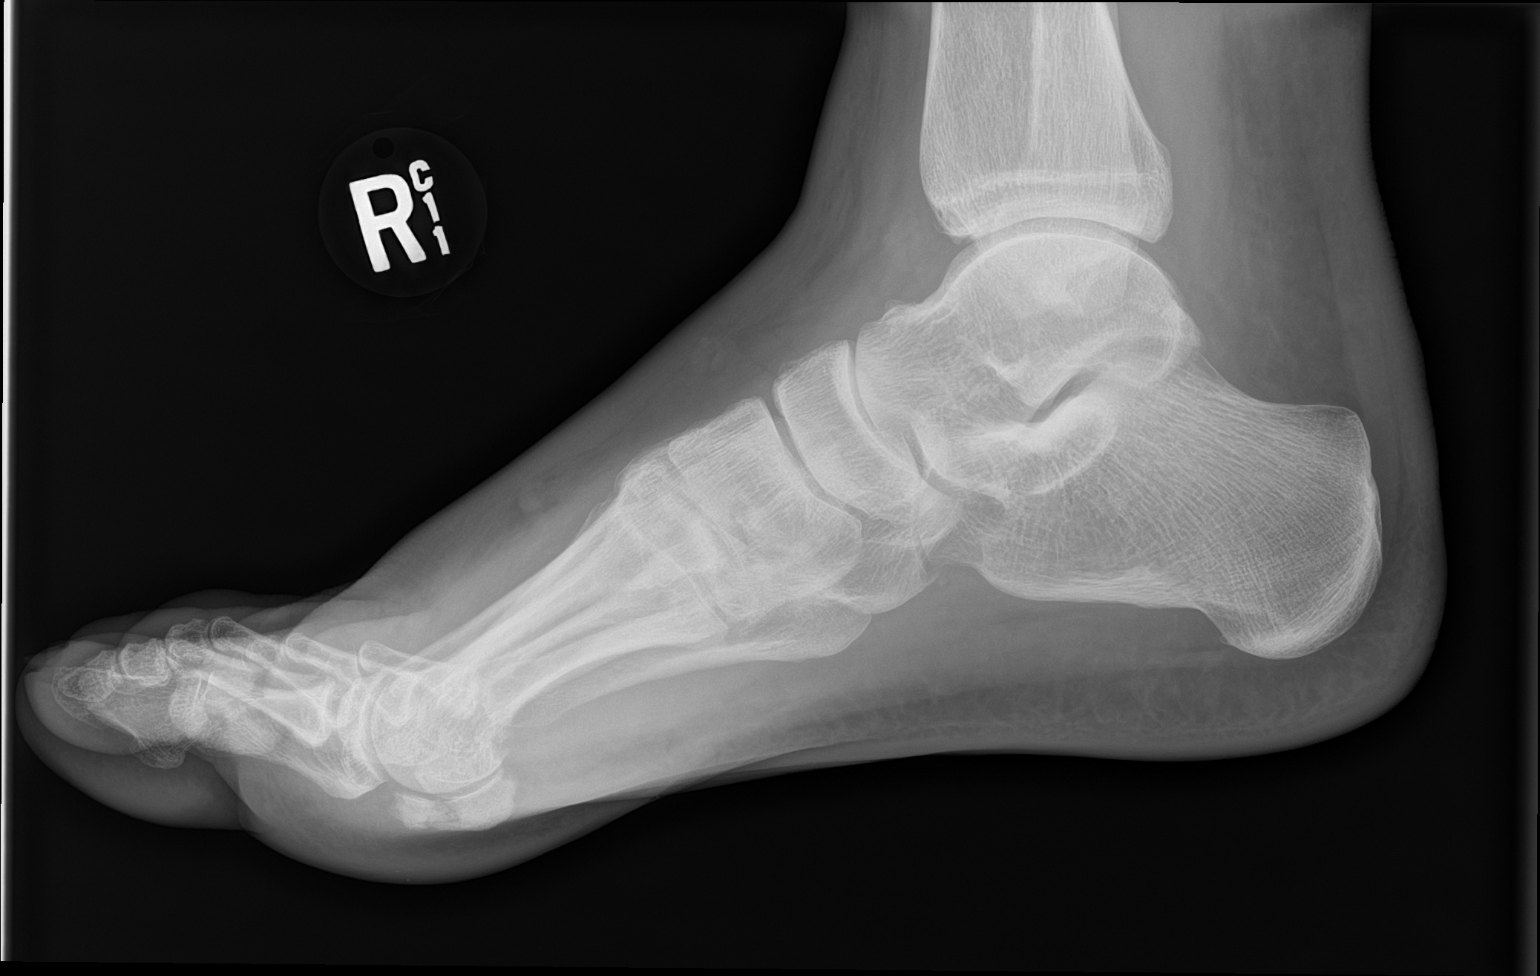

[3 of 3 positions shown; findings below may reference images not displayed]

FINDINGS: Mild degenerative changes at the first MTP joint. No evidence of
acute fracture or dislocation. Small well corticated bony fragment
adjacent the medial aspect of the head of the first metatarsal
unchanged.
IMPRESSION: No acute fracture.

## 2022-12-31 ENCOUNTER — Emergency Department (HOSPITAL_COMMUNITY)
Admission: EM | Admit: 2022-12-31 | Discharge: 2022-12-31 | Disposition: A | Discharge status: Home / Self Care | Payer: 59 | Attending: Student | Admitting: Student

## 2022-12-31 ENCOUNTER — Emergency Department (HOSPITAL_COMMUNITY): Payer: 59

## 2022-12-31 ENCOUNTER — Other Ambulatory Visit: Payer: Self-pay

## 2022-12-31 ENCOUNTER — Encounter (HOSPITAL_COMMUNITY): Payer: Self-pay

## 2022-12-31 DIAGNOSIS — S8992XA Unspecified injury of left lower leg, initial encounter: Secondary | ICD-10-CM | POA: Diagnosis not present

## 2022-12-31 DIAGNOSIS — S81852A Open bite, left lower leg, initial encounter: Secondary | ICD-10-CM | POA: Insufficient documentation

## 2022-12-31 DIAGNOSIS — S81812A Laceration without foreign body, left lower leg, initial encounter: Secondary | ICD-10-CM | POA: Diagnosis not present

## 2022-12-31 DIAGNOSIS — W540XXA Bitten by dog, initial encounter: Secondary | ICD-10-CM | POA: Insufficient documentation

## 2022-12-31 DIAGNOSIS — Z23 Encounter for immunization: Secondary | ICD-10-CM | POA: Insufficient documentation

## 2022-12-31 MED ORDER — LIDOCAINE HCL (PF) 2 % IJ SOLN
INTRAMUSCULAR | Status: AC
  Filled 2022-12-31: qty 5

## 2022-12-31 MED ORDER — OXYCODONE-ACETAMINOPHEN 5-325 MG PO TABS
1.0000 | ORAL_TABLET | Freq: Once | ORAL | Status: AC
  Administered 2022-12-31: 1 via ORAL
  Filled 2022-12-31: qty 1

## 2022-12-31 MED ORDER — AMOXICILLIN-POT CLAVULANATE 875-125 MG PO TABS
1.0000 | ORAL_TABLET | Freq: Once | ORAL | Status: AC
  Administered 2022-12-31: 1 via ORAL
  Filled 2022-12-31: qty 1

## 2022-12-31 MED ORDER — TETANUS-DIPHTH-ACELL PERTUSSIS 5-2.5-18.5 LF-MCG/0.5 IM SUSY
0.5000 mL | PREFILLED_SYRINGE | Freq: Once | INTRAMUSCULAR | Status: AC
  Administered 2022-12-31: 0.5 mL via INTRAMUSCULAR
  Filled 2022-12-31: qty 0.5

## 2022-12-31 MED ORDER — LIDOCAINE HCL (PF) 2 % IJ SOLN
10.0000 mL | Freq: Once | INTRAMUSCULAR | Status: AC
  Administered 2022-12-31: 10 mL via INTRADERMAL

## 2022-12-31 MED ORDER — AMOXICILLIN-POT CLAVULANATE 875-125 MG PO TABS: 1.0000 | ORAL_TABLET | Freq: Two times a day (BID) | ORAL | 0 refills | Status: DC

## 2022-12-31 MED ORDER — OXYCODONE-ACETAMINOPHEN 5-325 MG PO TABS: 1.0000 | ORAL_TABLET | Freq: Four times a day (QID) | ORAL | 0 refills | Status: DC | PRN

## 2022-12-31 MED ORDER — OXYCODONE-ACETAMINOPHEN 5-325 MG PO TABS: 1.0000 | ORAL_TABLET | ORAL | 0 refills | Status: AC | PRN

## 2022-12-31 MED ORDER — IBUPROFEN 800 MG PO TABS: 800.0000 mg | ORAL_TABLET | Freq: Three times a day (TID) | ORAL | 0 refills | Status: AC

## 2022-12-31 NOTE — Discharge Instructions (Signed)
Elevate your leg is much as possible.  Clean the wounds with mild soap and water.  Keep the area bandaged.  SPECT some drainage from the wound.  It is important that you take the antibiotic as directed until finished.  Please follow-up with your primary care provider on Monday for recheck.  Return to the emergency department for any new or worsening symptoms.

## 2022-12-31 NOTE — ED Triage Notes (Signed)
Pt arrived via POV c/o dog bite to left lower leg that occurred last night. Pt reports the dog is up to date on all of its vaccinations and shots. Pt reports there is new swelling to LLE and muscle twitching.

## 2022-12-31 NOTE — ED Notes (Signed)
Called for Pt X1. No answer 

## 2022-12-31 NOTE — ED Provider Notes (Signed)
[place] EMERGENCY DEPARTMENT AT [facility] Provider Note   CSN: [id number] Arrival date & time: 12/31/22  1845     History  Chief Complaint  Patient presents with   Animal Bite    Ian Ali is a 42 y.o. male.   Animal Bite Associated symptoms: no fever         Ian Ali is a 42 y.o. male who presents to the Emergency Department complaining of dog bite to left lower leg.  States that he was at a friend's house and his dog bit his left lower leg.  Incident occurred nearly 24 hours ago patient states the dog is up-to-date on vaccinations.  He cleaned the wound with tap water.  States that he did not come to the emergency room last evening because he fell asleep.  Here this evening with complaint of increasing pain near the wound and swelling of his left lower leg.  Incident occurred in [place] near [place].    Home Medications Prior to Admission medications   Medication Sig Start Date End Date Taking? Authorizing Provider  ondansetron (ZOFRAN ODT) 4 MG disintegrating tablet Take 1 tablet (4 mg total) by mouth every 8 (eight) hours as needed. Patient not taking: Reported on 09/27/2019 03/18/19   Vanetta Mulders, MD      Allergies    Patient has no known allergies.    Review of Systems   Review of Systems  Constitutional:  Negative for chills and fever.  Gastrointestinal:  Negative for diarrhea, nausea and vomiting.  Genitourinary:  Negative for dysuria.  Musculoskeletal:  Positive for myalgias (Left lower leg pain, laceration, dog bite).  Skin:  Positive for wound.  Neurological:  Negative for dizziness, syncope and weakness.    Physical Exam Updated Vital Signs BP (!) 148/99 (BP Location: Right Arm)   Pulse 91   Temp 98.7 F (37.1 C) (Oral)   Resp 18   Ht 6' (1.829 m)   Wt 110 kg   SpO2 100%   BMI 32.89 kg/m  Physical Exam Vitals and nursing note reviewed.  Constitutional:      General: He is not in acute distress.    Appearance:  Normal appearance. He is not ill-appearing or toxic-appearing.  Cardiovascular:     Rate and Rhythm: Normal rate and regular rhythm.     Pulses: Normal pulses.  Pulmonary:     Effort: Pulmonary effort is normal.  Musculoskeletal:        General: Swelling, tenderness and signs of injury present.     Left lower leg: Swelling, laceration and tenderness present.     Comments: Large gaping V-shaped laceration to the posterior aspect of the left lower leg.  No foreign bodies seen.  Mild serous drainage.  Compartments are soft.  Achilles tendon appears intact, nontender.  See attached photos  Skin:    General: Skin is warm.     Capillary Refill: Capillary refill takes less than 2 seconds.  Neurological:     General: No focal deficit present.     Mental Status: He is alert.     Sensory: No sensory deficit.     Motor: No weakness.        ED Results / Procedures / Treatments   Labs (all labs ordered are listed, but only abnormal results are displayed) Labs Reviewed - No data to display  EKG None  Radiology DG Tibia/Fibula Left  Result Date: 12/31/2022 CLINICAL DATA:  Dog bite to the left lower extremity. EXAM:  LEFT TIBIA AND FIBULA - 2 VIEW COMPARISON:  Left ankle radiograph dated 10/15/2003. FINDINGS: No acute fracture or dislocation. The bones are well mineralized. Mild narrowing of the lateral compartment of the knee. There is laceration of the skin and soft tissues of the posterior calf. No radiopaque foreign object. IMPRESSION: 1. No acute fracture or dislocation. 2. Laceration of the posterior calf. Electronically Signed   By: Elgie Collard M.D.   On: 12/31/2022 21:46    Procedures Procedures    LACERATION REPAIR Performed by: Woodrow Drab Authorized by: Sharma Lawrance Consent: Verbal consent obtained. Risks and benefits: risks, benefits and alternatives were discussed Consent given by: patient Patient identity confirmed: provided demographic data Prepped and Draped  in normal sterile fashion Wound explored  Laceration Location: Left lower leg  Laceration Length: 6 x 6 cm   No Foreign Bodies seen or palpated  Anesthesia: local infiltration  Local anesthetic: lidocaine 2% without epinephrine  Anesthetic total: 6 ml  Irrigation method: syringe Amount of cleaning: standard  Skin closure: Loose approximation with 4-0 Prolene  Number of sutures: 6  Technique: Simple interrupted  Patient tolerance: Patient tolerated the procedure well with no immediate complications.   Medications Ordered in ED Medications  lidocaine HCl (PF) (XYLOCAINE) 2 % injection (has no administration in time range)  Tdap (BOOSTRIX) injection 0.5 mL (0.5 mLs Intramuscular Given 12/31/22 2136)  oxyCODONE-acetaminophen (PERCOCET/ROXICET) 5-325 MG per tablet 1 tablet (1 tablet Oral Given 12/31/22 2132)  amoxicillin-clavulanate (AUGMENTIN) 875-125 MG per tablet 1 tablet (1 tablet Oral Given 12/31/22 2132)  lidocaine HCl (PF) (XYLOCAINE) 2 % injection 10 mL (10 mLs Intradermal Given 12/31/22 2219)    ED Course/ Medical Decision Making/ A&P                             Medical Decision Making Patient here with large laceration secondary to dog bite that occurred nearly 24 hours ago.  Order put in for notification to animal control, occurred in [facility], but patient unwilling to give address for where incident occurred..  Patient states the dog's vaccinations are up-to-date.    Large laceration that is gaping to the posterior lower leg, will need irrigation and loose approximation.  Patient refused IV antibiotics, oral given, Td updated  Amount and/or Complexity of Data Reviewed Radiology: ordered. Discussion of management or test interpretation with external provider(s): Large laceration to posterior leg from dog bite.  Wound is nearly 24 hours old prior to ER arrival.  High risk for infection.  Wound edges loosely approximated, patient started on antibiotics, Td  updated.  He is ambulatory with steady gait able to bear weight to the extremity.  Vascularly intact.  Counseled patient on high risk for infection and importance of proper wound care and close follow-up.  He is agreeable to follow-up with his PCP on Monday for recheck.  I have given strict ER return precautions as well.  Risk Prescription drug management.           Final Clinical Impression(s) / ED Diagnoses Final diagnoses:  Dog bite, initial encounter    Rx / DC Orders ED Discharge Orders     None         Pauline Aus, PA-C 01/01/23 0007    Glendora Score, MD 01/01/23 [phone]

## 2022-12-31 NOTE — ED Notes (Signed)
Cleaned and wrapped wound. Educated pt on wound care. Supplied pt with dressing change supplies until he can get to the store. Called animal control but pt refused to give address so animal control could not do anything further. PA aware

## 2023-01-01 MED FILL — Oxycodone w/ Acetaminophen Tab 5-325 MG: ORAL | Qty: 6 | Status: AC

## 2023-01-01 NOTE — ED Provider Notes (Incomplete)
[place] EMERGENCY DEPARTMENT AT [facility] Provider Note   CSN: [id number] Arrival date & time: 12/31/22  1845     History {Add pertinent medical, surgical, social history, OB history to HPI:1} Chief Complaint  Patient presents with  . Animal Bite    Ian Ali is a 42 y.o. male.   Animal Bite Associated symptoms: no fever         Ian Ali is a 42 y.o. male who presents to the Emergency Department complaining of dog bite to left lower leg.  States that he was at a friend's house and his dog bit his left lower leg.  Incident occurred around midnight last evening.  Patient states the dog is up-to-date on vaccinations.  He cleaned the wound with tap water.  States that he did not come to the emergency room last evening because he fell asleep.  Here this evening with complaint of increasing pain near the wound and swelling of his left lower leg.  Incident occurred in [place] near [place].    Home Medications Prior to Admission medications   Medication Sig Start Date End Date Taking? Authorizing Provider  ondansetron (ZOFRAN ODT) 4 MG disintegrating tablet Take 1 tablet (4 mg total) by mouth every 8 (eight) hours as needed. Patient not taking: Reported on 09/27/2019 03/18/19   Vanetta Mulders, MD      Allergies    Patient has no known allergies.    Review of Systems   Review of Systems  Constitutional:  Negative for chills and fever.  Gastrointestinal:  Negative for diarrhea, nausea and vomiting.  Genitourinary:  Negative for dysuria.  Musculoskeletal:  Positive for myalgias (Left lower leg pain, laceration, dog bite).  Skin:  Positive for wound.  Neurological:  Negative for dizziness, syncope and weakness.    Physical Exam Updated Vital Signs BP (!) 148/99 (BP Location: Right Arm)   Pulse 91   Temp 98.7 F (37.1 C) (Oral)   Resp 18   Ht 6' (1.829 m)   Wt 110 kg   SpO2 100%   BMI 32.89 kg/m  Physical Exam Vitals and nursing note reviewed.   Constitutional:      General: He is not in acute distress.    Appearance: Normal appearance. He is not ill-appearing or toxic-appearing.  Cardiovascular:     Rate and Rhythm: Normal rate and regular rhythm.     Pulses: Normal pulses.  Pulmonary:     Effort: Pulmonary effort is normal.  Musculoskeletal:        General: Swelling, tenderness and signs of injury present.     Left lower leg: Swelling, laceration and tenderness present.     Comments: Large gaping V-shaped laceration to the posterior aspect of the left lower leg.  No foreign bodies seen.  Mild serous drainage.  Compartments are soft.  Achilles tendon appears intact, nontender.  See attached photos  Skin:    General: Skin is warm.     Capillary Refill: Capillary refill takes less than 2 seconds.  Neurological:     General: No focal deficit present.     Mental Status: He is alert.     Sensory: No sensory deficit.     Motor: No weakness.        ED Results / Procedures / Treatments   Labs (all labs ordered are listed, but only abnormal results are displayed) Labs Reviewed - No data to display  EKG None  Radiology DG Tibia/Fibula Left  Result Date: 12/31/2022 CLINICAL  DATA:  Dog bite to the left lower extremity. EXAM: LEFT TIBIA AND FIBULA - 2 VIEW COMPARISON:  Left ankle radiograph dated 10/15/2003. FINDINGS: No acute fracture or dislocation. The bones are well mineralized. Mild narrowing of the lateral compartment of the knee. There is laceration of the skin and soft tissues of the posterior calf. No radiopaque foreign object. IMPRESSION: 1. No acute fracture or dislocation. 2. Laceration of the posterior calf. Electronically Signed   By: Elgie Collard M.D.   On: 12/31/2022 21:46    Procedures Procedures  {Document cardiac monitor, telemetry assessment procedure when appropriate:1}  Medications Ordered in ED Medications - No data to display  ED Course/ Medical Decision Making/ A&P   {   Click here for  ABCD2, HEART and other calculatorsREFRESH Note before signing :1}                          Medical Decision Making Patient here with large laceration secondary to dog bite that occurred nearly 24 hours ago.  Order put in for notification to animal control, occurred in [facility], but patient unwilling to give address for where incident occurred..  Patient states the dog's vaccinations are up-to-date.    Large laceration that is gaping to the posterior lower leg, will need irrigation and loose approximation.  Patient refused IV antibiotics, oral given, Td updated  Amount and/or Complexity of Data Reviewed Radiology: ordered. Discussion of management or test interpretation with external provider(s): Large laceration to posterior leg from dog bite.  Wound is nearly 24 hours old prior to ER arrival.  High risk for infection.  Wound edges loosely approximated, patient started on antibiotics, Td updated.  He is ambulatory with steady gait able to bear weight to the extremity.  Vascularly intact.  Counseled   Risk Prescription drug management.   ***  {Document critical care time when appropriate:1} {Document review of labs and clinical decision tools ie heart score, Chads2Vasc2 etc:1}  {Document your independent review of radiology images, and any outside records:1} {Document your discussion with family members, caretakers, and with consultants:1} {Document social determinants of health affecting pt's care:1} {Document your decision making why or why not admission, treatments were needed:1} Final Clinical Impression(s) / ED Diagnoses Final diagnoses:  None    Rx / DC Orders ED Discharge Orders     None

## 2023-01-06 ENCOUNTER — Emergency Department (HOSPITAL_COMMUNITY)
Admission: EM | Admit: 2023-01-06 | Discharge: 2023-01-06 | Disposition: A | Discharge status: Home / Self Care | Payer: 59 | Attending: Emergency Medicine | Admitting: Emergency Medicine

## 2023-01-06 DIAGNOSIS — F17210 Nicotine dependence, cigarettes, uncomplicated: Secondary | ICD-10-CM | POA: Insufficient documentation

## 2023-01-06 DIAGNOSIS — Z4800 Encounter for change or removal of nonsurgical wound dressing: Secondary | ICD-10-CM | POA: Insufficient documentation

## 2023-01-06 DIAGNOSIS — L089 Local infection of the skin and subcutaneous tissue, unspecified: Secondary | ICD-10-CM

## 2023-01-06 DIAGNOSIS — L0889 Other specified local infections of the skin and subcutaneous tissue: Secondary | ICD-10-CM | POA: Diagnosis not present

## 2023-01-06 MED ORDER — VANCOMYCIN HCL 2000 MG/400ML IV SOLN: 2000.0000 mg | Freq: Once | INTRAVENOUS | Status: DC

## 2023-01-06 MED ORDER — PIPERACILLIN-TAZOBACTAM 3.375 G IVPB 30 MIN
3.3750 g | Freq: Once | INTRAVENOUS | Status: DC
  Filled 2023-01-06: qty 50

## 2023-01-06 MED ORDER — SODIUM CHLORIDE 0.9 % IV BOLUS: 1000.0000 mL | Freq: Once | INTRAVENOUS | Status: DC

## 2023-01-06 MED ORDER — HYDROMORPHONE HCL 1 MG/ML IJ SOLN
1.0000 mg | Freq: Once | INTRAMUSCULAR | Status: DC
  Filled 2023-01-06: qty 1

## 2023-01-06 NOTE — ED Provider Notes (Signed)
[facility]ENCY DEPT [facility] Emergency Department Provider Note MRN:  [id number]  Arrival date & time: 01/06/23     Chief Complaint   Wound check History of Present Illness   Tiran Kochel is a 42 y.o. year-old male with a history of substance use disorder presenting to the ED with chief complaint of wound check.  Recent dog bite to the calf, here for wound check.  The wound has a foul smell and has surrounding erythema.  Has been taking the Augmentin as prescribed.  Review of Systems  A thorough review of systems was obtained and all systems are negative except as noted in the HPI and PMH.   Patient's Health History    Past Medical History:  Diagnosis Date   Substance abuse (HCC)     No past surgical history on file.  No family history on file.  Social History   Socioeconomic History   Marital status: Single    Spouse name: Not on file   Number of children: Not on file   Years of education: Not on file   Highest education level: Not on file  Occupational History   Not on file  Tobacco Use   Smoking status: Every Day    Packs/day: 1    Types: Cigarettes   Smokeless tobacco: Never  Vaping Use   Vaping Use: Never used  Substance and Sexual Activity   Alcohol use: Not Currently    Comment: occ   Drug use: Not Currently    Types: IV    Comment: heroin twice a week last used a week ago   Sexual activity: Not on file  Other Topics Concern   Not on file  Social History Narrative   Not on file   Social Determinants of Health   Financial Resource Strain: Not on file  Food Insecurity: Not on file  Transportation Needs: Not on file  Physical Activity: Not on file  Stress: Not on file  Social Connections: Not on file  Intimate Partner Violence: Not on file     Physical Exam   Vitals:   01/06/23 0105  BP: 137/81  Pulse: 82  Resp: 18  Temp: 97.8 F (36.6 C)  SpO2: 99%    CONSTITUTIONAL: Well-appearing, NAD NEURO/PSYCH:  Alert and oriented x 3, no  focal deficits EYES:  eyes equal and reactive ENT/NECK:  no LAD, no JVD CARDIO: Regular rate, well-perfused, normal S1 and S2 PULM:  CTAB no wheezing or rhonchi GI/GU:  non-distended, non-tender MSK/SPINE:  No gross deformities, no edema SKIN: Open wound to the calf very loosely sutured with foul odor, tender to palpation, surrounding erythema   *Additional and/or pertinent findings included in MDM below  Diagnostic and Interventional Summary    EKG Interpretation Date/Time:    Ventricular Rate:    PR Interval:    QRS Duration:    QT Interval:    QTC Calculation:   R Axis:      Text Interpretation:         Labs Reviewed - No data to display  No orders to display    Medications  sodium chloride 0.9 % bolus 1,000 mL (has no administration in time range)  piperacillin-tazobactam (ZOSYN) IVPB 3.375 g (has no administration in time range)  HYDROmorphone (DILAUDID) injection 1 mg (has no administration in time range)  vancomycin (VANCOREADY) IVPB 2000 mg/400 mL (has no administration in time range)     Procedures  /  Critical Care Procedures  ED Course and Medical Decision  Making  Initial Impression and Ddx Concern for wound infection despite Augmentin.  Wound caused by dog bite.  Patient needs admission for IV antibiotics.  Past medical/surgical history that increases complexity of ED encounter: Substance use disorder  Interpretation of Diagnostics Pending  Patient Reassessment and Ultimate Disposition/Management     Patient told nursing that he had to leave and would be leaving AGAINST MEDICAL ADVICE.  Patient was made aware of the risks of leaving, including worsening infection and death.  Patient management required discussion with the following services or consulting groups:  None  Complexity of Problems Addressed Acute illness or injury that poses threat of life of bodily function  Additional Data Reviewed and Analyzed Further history obtained  from: Further history from spouse/family member  Additional Factors Impacting ED Encounter Risk Consideration of hospitalization  Elmer Sow. Pilar Plate, MD [facility] Emergency Medicine [facility] [email or web address]  Final Clinical Impressions(s) / ED Diagnoses     ICD-10-CM   1. Wound infection  T14.8XXA    L08.9       ED Discharge Orders     None        Discharge Instructions Discussed with and Provided to Patient:   Discharge Instructions   None      Sabas Sous, MD 01/06/23 [id number]

## 2023-01-06 NOTE — ED Triage Notes (Signed)
Pt states he was seen here on the 27th for a dog bite to the lower left leg and had sutures placed. Was told to follow up with his PCP in 3 days for a wound check but was unable to get an appointment. Wants to make sure it is not infected

## 2023-01-06 NOTE — ED Notes (Addendum)
This RN went to start an IV and medications when pt asked if he could go outside to smoke. Informed pt that he could not and offered a nicotine patch. Pt declined and asked if he could leave and come back later. Pt signed out ama. EDP made aware

## 2023-01-08 ENCOUNTER — Inpatient Hospital Stay (HOSPITAL_COMMUNITY)
Admission: EM | Admit: 2023-01-08 | Discharge: 2023-01-12 | DRG: 571 | Disposition: A | Discharge status: Home / Self Care | Payer: MEDICAID | Attending: Family Medicine | Admitting: Family Medicine

## 2023-01-08 ENCOUNTER — Other Ambulatory Visit: Payer: Self-pay

## 2023-01-08 ENCOUNTER — Emergency Department (HOSPITAL_COMMUNITY): Payer: MEDICAID

## 2023-01-08 ENCOUNTER — Encounter (HOSPITAL_COMMUNITY): Payer: Self-pay

## 2023-01-08 DIAGNOSIS — S81832A Puncture wound without foreign body, left lower leg, initial encounter: Principal | ICD-10-CM

## 2023-01-08 DIAGNOSIS — W540XXA Bitten by dog, initial encounter: Secondary | ICD-10-CM | POA: Diagnosis not present

## 2023-01-08 DIAGNOSIS — L089 Local infection of the skin and subcutaneous tissue, unspecified: Principal | ICD-10-CM | POA: Diagnosis present

## 2023-01-08 DIAGNOSIS — F17210 Nicotine dependence, cigarettes, uncomplicated: Secondary | ICD-10-CM | POA: Diagnosis present

## 2023-01-08 DIAGNOSIS — Z6832 Body mass index (BMI) 32.0-32.9, adult: Secondary | ICD-10-CM | POA: Diagnosis not present

## 2023-01-08 DIAGNOSIS — F17200 Nicotine dependence, unspecified, uncomplicated: Secondary | ICD-10-CM | POA: Insufficient documentation

## 2023-01-08 DIAGNOSIS — E669 Obesity, unspecified: Secondary | ICD-10-CM | POA: Insufficient documentation

## 2023-01-08 DIAGNOSIS — T148XXA Other injury of unspecified body region, initial encounter: Secondary | ICD-10-CM | POA: Diagnosis present

## 2023-01-08 DIAGNOSIS — L03116 Cellulitis of left lower limb: Secondary | ICD-10-CM | POA: Diagnosis present

## 2023-01-08 LAB — CBC WITH DIFFERENTIAL/PLATELET
Abs Immature Granulocytes: 0.01 10*3/uL (ref 0.00–0.07)
Basophils Absolute: 0 10*3/uL (ref 0.0–0.1)
Basophils Relative: 1 %
Eosinophils Absolute: 0.5 10*3/uL (ref 0.0–0.5)
Eosinophils Relative: 9 %
HCT: 37.7 % — ABNORMAL LOW (ref 39.0–52.0)
Hemoglobin: 12 g/dL — ABNORMAL LOW (ref 13.0–17.0)
Immature Granulocytes: 0 %
Lymphocytes Relative: 34 %
Lymphs Abs: 1.8 10*3/uL (ref 0.7–4.0)
MCH: 28.4 pg (ref 26.0–34.0)
MCHC: 31.8 g/dL (ref 30.0–36.0)
MCV: 89.1 fL (ref 80.0–100.0)
Monocytes Absolute: 0.6 10*3/uL (ref 0.1–1.0)
Monocytes Relative: 11 %
Neutro Abs: 2.4 10*3/uL (ref 1.7–7.7)
Neutrophils Relative %: 45 %
Platelets: 156 10*3/uL (ref 150–400)
RBC: 4.23 MIL/uL (ref 4.22–5.81)
RDW: 14.6 % (ref 11.5–15.5)
WBC: 5.4 10*3/uL (ref 4.0–10.5)
nRBC: 0 % (ref 0.0–0.2)

## 2023-01-08 LAB — COMPREHENSIVE METABOLIC PANEL
ALT: 35 U/L (ref 0–44)
AST: 42 U/L — ABNORMAL HIGH (ref 15–41)
Albumin: 3.5 g/dL (ref 3.5–5.0)
Alkaline Phosphatase: 67 U/L (ref 38–126)
Anion gap: 8 (ref 5–15)
BUN: 16 mg/dL (ref 6–20)
CO2: 27 mmol/L (ref 22–32)
Calcium: 9 mg/dL (ref 8.9–10.3)
Chloride: 100 mmol/L (ref 98–111)
Creatinine, Ser: 0.82 mg/dL (ref 0.61–1.24)
GFR, Estimated: >60 mL/min (ref >60)
Glucose, Bld: 104 mg/dL — ABNORMAL HIGH (ref 70–99)
Potassium: 4.3 mmol/L (ref 3.5–5.1)
Sodium: 135 mmol/L (ref 135–145)
Total Bilirubin: 0.3 mg/dL (ref 0.3–1.2)
Total Protein: 8.2 g/dL — ABNORMAL HIGH (ref 6.5–8.1)

## 2023-01-08 LAB — LACTIC ACID, PLASMA: Lactic Acid, Venous: 1.3 mmol/L (ref 0.5–1.9)

## 2023-01-08 LAB — CULTURE, BLOOD (ROUTINE X 2)

## 2023-01-08 MED ORDER — PIPERACILLIN-TAZOBACTAM 4.5 G IVPB
4.5000 g | Freq: Once | INTRAVENOUS | Status: AC
  Administered 2023-01-08: 4.5 g via INTRAVENOUS
  Filled 2023-01-08: qty 100

## 2023-01-08 MED ORDER — IOHEXOL 300 MG/ML  SOLN
100.0000 mL | Freq: Once | INTRAMUSCULAR | Status: AC | PRN
  Administered 2023-01-08: 100 mL via INTRAVENOUS

## 2023-01-08 MED ORDER — VANCOMYCIN HCL 1500 MG/300ML IV SOLN
1500.0000 mg | Freq: Two times a day (BID) | INTRAVENOUS | Status: DC
  Administered 2023-01-08 – 2023-01-11 (×7): 1500 mg via INTRAVENOUS
  Filled 2023-01-08 (×8): qty 300

## 2023-01-08 NOTE — H&P (Signed)
History and Physical    Patient: Ian Ali ZOX:[id number] DOB: [birth year] DOA: 01/08/2023 DOS: the patient was seen and examined on 01/09/2023 PCP: Bennie Pierini, FNP  Patient coming from: Home  Chief Complaint:  Chief Complaint  Patient presents with   Wound Infection   HPI: Ian Ali is a 42 y.o. male with medical history significant of tobacco use disorder and substance abuse who presents to the emergency department with wound infection.  Patient endorsed a dog bite to left lower leg and presented to the emergency department on June 27.  Sutures was placed and was asked to follow-up with his PCP within 3 days of ED presentation for a wound check, but he was unable to get an appointment, so he returned to the ED on 7/3 where the wound was noted to have a foul smell with a surrounding erythema, he has been taking Augmentin.  Patient was going to be admitted to the hospital, but he told the nursing staff that he had to leave, so he left AGAINST MEDICAL ADVICE.  He returned today due to worsening swelling and was willing to be admitted to the hospital.  Patient endorsed pain at the wound site, but denies chest pain, shortness of breath, fever, chills.  ED Course:  In the emergency department, temperature was 97.7 F, pulse 101 bpm, respiration 25/min, BP 144/84, O2 sat 97% on room air.  Workup in the ED of 12.0 and hematocrit of 37.7.  BMP was normal except for blood glucose of 104.  AST 42, ALT 35, ALP 67.  Lactic acid 1.3.  Blood culture pending CT of the lower left extremity with contrast showed 1. Diffuse subcutaneous edema and swelling of the lower extremity and dorsum of the proximal foot. Superficial laceration is seen in the posterior calf. No evidence for foreign body, fluid collection or soft tissue gas. 2. No acute osseous abnormality. He was treated with IV vancomycin and Zosyn.  Hospitalist was asked to admit patient for further evaluation and management.  Review of  Systems: Review of systems as noted in the HPI. All other systems reviewed and are negative.   Past Medical History:  Diagnosis Date   Substance abuse (HCC)    History reviewed. No pertinent surgical history.  Social History:  reports that he has been smoking cigarettes. He has been smoking an average of 1 pack per day. He has never used smokeless tobacco. He reports that he does not currently use alcohol. He reports that he does not currently use drugs after having used the following drugs: IV.   No Known Allergies  History reviewed. No pertinent family history.   Prior to Admission medications   Medication Sig Start Date End Date Taking? Authorizing Provider  amoxicillin-clavulanate (AUGMENTIN) 875-125 MG tablet Take 1 tablet by mouth every 12 (twelve) hours. 12/31/22   Triplett, Tammy, PA-C  ibuprofen (ADVIL) 800 MG tablet Take 1 tablet (800 mg total) by mouth 3 (three) times daily. Take with food 12/31/22   Triplett, Tammy, PA-C  ondansetron (ZOFRAN ODT) 4 MG disintegrating tablet Take 1 tablet (4 mg total) by mouth every 8 (eight) hours as needed. Patient not taking: Reported on 09/27/2019 03/18/19   Vanetta Mulders, MD  oxyCODONE-acetaminophen (PERCOCET/ROXICET) 5-325 MG tablet Take 1 tablet by mouth every 4 (four) hours as needed. 12/31/22   Triplett, Tammy, PA-C  oxyCODONE-acetaminophen (PERCOCET/ROXICET) 5-325 MG tablet Take 1 tablet by mouth every 6 (six) hours as needed for severe pain. 12/31/22   Pauline Aus, PA-C  Physical Exam: BP (!) 140/92 (BP Location: Right Arm)   Pulse 83   Temp 98.7 F (37.1 C)   Resp 20   Ht 6' (1.829 m)   Wt 110 kg   SpO2 100%   BMI 32.89 kg/m   General: 42 y.o. year-old male well developed well nourished in no acute distress.  Alert and oriented x3. HEENT: NCAT, EOMI Neck: Supple, trachea medial Cardiovascular: Regular rate and rhythm with no rubs or gallops.  No thyromegaly or JVD noted.  No lower extremity edema. 2/4 pulses in all 4  extremities. Respiratory: Clear to auscultation with no wheezes or rales. Good inspiratory effort. Abdomen: Soft, nontender nondistended with normal bowel sounds x4 quadrants. Muskuloskeletal: No cyanosis, clubbing or edema noted bilaterally Neuro: CN II-XII intact, strength 5/5 x 4, sensation, reflexes intact Skin: Posterior left calf wound with a foul odor and associated leg swelling.  No noted drainage from the wound Psychiatry: Judgement and insight appear normal. Mood is appropriate for condition and setting              Labs on Admission:  Basic Metabolic Panel: Recent Labs  Lab 01/08/23 2049  NA 135  K 4.3  CL 100  CO2 27  GLUCOSE 104*  BUN 16  CREATININE 0.82  CALCIUM 9.0   Liver Function Tests: Recent Labs  Lab 01/08/23 2049  AST 42*  ALT 35  ALKPHOS 67  BILITOT 0.3  PROT 8.2*  ALBUMIN 3.5   No results for input(s): "LIPASE", "AMYLASE" in the last 168 hours. No results for input(s): "AMMONIA" in the last 168 hours. CBC: Recent Labs  Lab 01/08/23 2049  WBC 5.4  NEUTROABS 2.4  HGB 12.0*  HCT 37.7*  MCV 89.1  PLT 156   Cardiac Enzymes: No results for input(s): "CKTOTAL", "CKMB", "CKMBINDEX", "TROPONINI" in the last 168 hours.  BNP (last 3 results) No results for input(s): "BNP" in the last 8760 hours.  ProBNP (last 3 results) No results for input(s): "PROBNP" in the last 8760 hours.  CBG: No results for input(s): "GLUCAP" in the last 168 hours.  Radiological Exams on Admission: CT TIBIA FIBULA LEFT W CONTRAST  Result Date: 01/08/2023 CLINICAL DATA:  Soft tissue infection suspected.  Dog bite. EXAM: CT OF THE LOWER LEFT EXTREMITY WITH CONTRAST TECHNIQUE: Multidetector CT imaging of the lower left extremity was performed according to the standard protocol following intravenous contrast administration. RADIATION DOSE REDUCTION: This exam was performed according to the departmental dose-optimization program which includes automated exposure control,  adjustment of the mA and/or kV according to patient size and/or use of iterative reconstruction technique. CONTRAST:  OMNIPAQUE IOHEXOL 300 MG/ML  SOLN COMPARISON:  Left tibia and fibula x-ray 12/31/2022 FINDINGS: Bones/Joint/Cartilage No evidence for fracture, dislocation or arthropathy. No significant joint effusion. Ligaments Suboptimally assessed by CT. Muscles and Tendons Within normal limits. No intramuscular air or fluid collection identified. Soft tissues There is diffuse subcutaneous edema and swelling of the lower extremity and dorsum of the proximal foot. Superficial laceration is seen in the posterior calf. There is no evidence for foreign body, fluid collection or soft tissue gas. IMPRESSION: 1. Diffuse subcutaneous edema and swelling of the lower extremity and dorsum of the proximal foot. Superficial laceration is seen in the posterior calf. No evidence for foreign body, fluid collection or soft tissue gas. 2. No acute osseous abnormality. Electronically Signed   By: Darliss Cheney M.D.   On: 01/08/2023 22:08   DG Chest Portable 1 View  Result  Date: 01/08/2023 CLINICAL DATA:  Admission.  Infected dog bite. EXAM: PORTABLE CHEST 1 VIEW COMPARISON:  09/27/2019 FINDINGS: Bibasilar linear opacities compatible with atelectasis. Heart and mediastinal contours within normal limits. No effusions or acute bony abnormality. IMPRESSION: Bibasilar atelectasis. Electronically Signed   By: Charlett Nose M.D.   On: 01/08/2023 21:17    EKG: I independently viewed the EKG done and my findings are as followed: Normal sinus rhythm at a rate of 86 bpm  Assessment/Plan Present on Admission: **None**  Principal Problem:   Puncture wound of left calf Active Problems:   Tobacco use disorder   Obesity (BMI 30-39.9)  Puncture wound of left calf Continue IV vancomycin Continue wound care Continue oxycodone 5 mg every 4 hours as needed  Tobacco use disorder  Patient was counseled on tobacco abuse  cessation  Obesity (BMI 32.89) Diet and lifestyle modification  DVT prophylaxis: Lovenox   Advance Care Planning: Full code  Consults: Wound nurse  Family Communication: None at bedside  Severity of Illness: The appropriate patient status for this patient is INPATIENT. Inpatient status is judged to be reasonable and necessary in order to provide the required intensity of service to ensure the patient's safety. The patient's presenting symptoms, physical exam findings, and initial radiographic and laboratory data in the context of their chronic comorbidities is felt to place them at high risk for further clinical deterioration. Furthermore, it is not anticipated that the patient will be medically stable for discharge from the hospital within 2 midnights of admission.   * I certify that at the point of admission it is my clinical judgment that the patient will require inpatient hospital care spanning beyond 2 midnights from the point of admission due to high intensity of service, high risk for further deterioration and high frequency of surveillance required.*  Author: Frankey Shown, DO 01/09/2023 2:08 AM  For on call review [email or web address].

## 2023-01-08 NOTE — ED Notes (Signed)
ED TO INPATIENT HANDOFF REPORT  ED Nurse Name and Phone #: Tobi Bastos [phone]  S Name/Age/Gender Ian Ali 42 y.o. male Room/Bed: APA14/APA14  Code Status   Code Status: Prior  Home/SNF/Other Home Patient oriented to: self, place, time, and situation Is this baseline? Yes   Triage Complete: Triage complete  Chief Complaint Wound infection [T14.8XXA, L08.9]  Triage Note Pt arrived from home via POV c/o persistent infected dog bite. Pt was advised to be admitted recently when he was here, but reports he had to leave AMA. Pt presents this evening ready to be admitted to receive IV Antibiotics for worsening infection with foul smelling odor and swelling to LLE.    Allergies No Known Allergies  Level of Care/Admitting Diagnosis ED Disposition     ED Disposition  Admit   Condition  --   Comment  Hospital Area: [facility] [[id number]]  Level of Care: Med-Surg [16]  Covid Evaluation: Asymptomatic - no recent exposure (last 10 days) testing not required  Diagnosis: Wound infection [130865]  Admitting Physician: Frankey Shown [[id number]]  Attending Physician: Frankey Shown [[id number]]  Certification:: I certify this patient will need inpatient services for at least 2 midnights  Estimated Length of Stay: 3          B Medical/Surgery History Past Medical History:  Diagnosis Date   Substance abuse (HCC)    History reviewed. No pertinent surgical history.   A IV Location/Drains/Wounds Patient Lines/Drains/Airways Status     Active Line/Drains/Airways     Name Placement date Placement time Site Days   Peripheral IV 01/08/23 20 G Left Antecubital 01/08/23  2104  Antecubital  less than 1            Intake/Output Last 24 hours No intake or output data in the 24 hours ending 01/08/23 2315  Labs/Imaging Results for orders placed or performed during the hospital encounter of 01/08/23 (from the past 48 hour(s))  CBC with Differential     Status: Abnormal    Collection Time: 01/08/23  8:49 PM  Result Value Ref Range   WBC 5.4 4.0 - 10.5 K/uL   RBC 4.23 4.22 - 5.81 MIL/uL   Hemoglobin 12.0 (L) 13.0 - 17.0 g/dL   HCT 24.4 (L) 01.0 - 27.2 %   MCV 89.1 80.0 - 100.0 fL   MCH 28.4 26.0 - 34.0 pg   MCHC 31.8 30.0 - 36.0 g/dL   RDW 53.6 64.4 - 03.4 %   Platelets 156 150 - 400 K/uL   nRBC 0.0 0.0 - 0.2 %   Neutrophils Relative % 45 %   Neutro Abs 2.4 1.7 - 7.7 K/uL   Lymphocytes Relative 34 %   Lymphs Abs 1.8 0.7 - 4.0 K/uL   Monocytes Relative 11 %   Monocytes Absolute 0.6 0.1 - 1.0 K/uL   Eosinophils Relative 9 %   Eosinophils Absolute 0.5 0.0 - 0.5 K/uL   Basophils Relative 1 %   Basophils Absolute 0.0 0.0 - 0.1 K/uL   Immature Granulocytes 0 %   Abs Immature Granulocytes 0.01 0.00 - 0.07 K/uL    Comment: Performed at [facility], [address] [place], [place] [address]  Comprehensive metabolic panel     Status: Abnormal   Collection Time: 01/08/23  8:49 PM  Result Value Ref Range   Sodium 135 135 - 145 mmol/L   Potassium 4.3 3.5 - 5.1 mmol/L   Chloride 100 98 - 111 mmol/L   CO2 27 22 - 32 mmol/L  Glucose, Bld 104 (H) 70 - 99 mg/dL    Comment: Glucose reference range applies only to samples taken after fasting for at least 8 hours.   BUN 16 6 - 20 mg/dL   Creatinine, Ser 1.61 0.61 - 1.24 mg/dL   Calcium 9.0 8.9 - 09.6 mg/dL   Total Protein 8.2 (H) 6.5 - 8.1 g/dL   Albumin 3.5 3.5 - 5.0 g/dL   AST 42 (H) 15 - 41 U/L   ALT 35 0 - 44 U/L   Alkaline Phosphatase 67 38 - 126 U/L   Total Bilirubin 0.3 0.3 - 1.2 mg/dL   GFR, Estimated >04 >54 mL/min    Comment: (NOTE) Calculated using the CKD-EPI Creatinine Equation (2021)    Anion gap 8 5 - 15    Comment: Performed at [facility], [address], [place], [place] [address]  Lactic acid, plasma     Status: None   Collection Time: 01/08/23  8:49 PM  Result Value Ref Range   Lactic Acid, Venous 1.3 0.5 - 1.9 mmol/L    Comment: Performed at [facility], [address]  [place], [place] [address]  Blood culture (routine x 2)     Status: None (Preliminary result)   Collection Time: 01/08/23  9:00 PM   Specimen: Left Antecubital; Blood  Result Value Ref Range   Specimen Description LEFT ANTECUBITAL    Special Requests      BOTTLES DRAWN AEROBIC AND ANAEROBIC Blood Culture adequate volume Performed at [facility], [address], [place], [place] [address]    Culture PENDING    Report Status PENDING    CT TIBIA FIBULA LEFT W CONTRAST  Result Date: 01/08/2023 CLINICAL DATA:  Soft tissue infection suspected.  Dog bite. EXAM: CT OF THE LOWER LEFT EXTREMITY WITH CONTRAST TECHNIQUE: Multidetector CT imaging of the lower left extremity was performed according to the standard protocol following intravenous contrast administration. RADIATION DOSE REDUCTION: This exam was performed according to the departmental dose-optimization program which includes automated exposure control, adjustment of the mA and/or kV according to patient size and/or use of iterative reconstruction technique. CONTRAST:  OMNIPAQUE IOHEXOL 300 MG/ML  SOLN COMPARISON:  Left tibia and fibula x-ray 12/31/2022 FINDINGS: Bones/Joint/Cartilage No evidence for fracture, dislocation or arthropathy. No significant joint effusion. Ligaments Suboptimally assessed by CT. Muscles and Tendons Within normal limits. No intramuscular air or fluid collection identified. Soft tissues There is diffuse subcutaneous edema and swelling of the lower extremity and dorsum of the proximal foot. Superficial laceration is seen in the posterior calf. There is no evidence for foreign body, fluid collection or soft tissue gas. IMPRESSION: 1. Diffuse subcutaneous edema and swelling of the lower extremity and dorsum of the proximal foot. Superficial laceration is seen in the posterior calf. No evidence for foreign body, fluid collection or soft tissue gas. 2. No acute osseous abnormality. Electronically Signed   By: Darliss Cheney M.D.   On:  01/08/2023 22:08   DG Chest Portable 1 View  Result Date: 01/08/2023 CLINICAL DATA:  Admission.  Infected dog bite. EXAM: PORTABLE CHEST 1 VIEW COMPARISON:  09/27/2019 FINDINGS: Bibasilar linear opacities compatible with atelectasis. Heart and mediastinal contours within normal limits. No effusions or acute bony abnormality. IMPRESSION: Bibasilar atelectasis. Electronically Signed   By: Charlett Nose M.D.   On: 01/08/2023 21:17    Pending Labs Unresulted Labs (From admission, onward)     Start     Ordered   01/08/23 2031  Blood culture (routine x  2)  BLOOD CULTURE X 2,   R (with STAT occurrences)     Question Answer Comment  Patient immune status Normal   Release to patient Immediate      01/08/23 2033            Vitals/Pain Today's Vitals   01/08/23 2115 01/08/23 2130 01/08/23 2215 01/08/23 2230  BP: (!) 141/83 128/86 124/72 138/69  Pulse: 93 79 81 81  Resp: 14 16    Temp:  98 F (36.7 C)    TempSrc:  Oral    SpO2: 97% 97% 97% 98%  Weight:      Height:      PainSc:        Isolation Precautions No active isolations  Medications Medications  vancomycin (VANCOREADY) IVPB 1500 mg/300 mL (1,500 mg Intravenous New Bag/Given 01/08/23 2204)  piperacillin-tazobactam (ZOSYN) IVPB 4.5 g (0 g Intravenous Stopped 01/08/23 2253)  iohexol (OMNIPAQUE) 300 MG/ML solution 100 mL (100 mLs Intravenous Contrast Given 01/08/23 2144)    Mobility walks     Focused Assessments Cardiac Assessment Handoff:    No results found for: "CKTOTAL", "CKMB", "CKMBINDEX", "TROPONINI" No results found for: "DDIMER" Does the Patient currently have chest pain? Yes    R Recommendations: See Admitting Provider Note  Report given to:   Additional Notes:

## 2023-01-08 NOTE — ED Provider Notes (Signed)
[place] EMERGENCY DEPARTMENT AT [facility] Provider Note  CSN: [id number] Arrival date & time: 01/08/23 2006  Chief Complaint(s) Wound Infection  HPI Ian Ali is a 42 y.o. male with history of substance abuse, reports sobriety presenting with wound infection.  Patient was bitten by dog, went to the emergency department 6/27.  He reported worsening symptoms and arrived again 7/3.  He was recommended to be admitted given worsening swelling, discharge and foul smell but left AGAINST MEDICAL ADVICE.  He returns today because the swelling has gotten progressively worse and now he is ready to be admitted.  He denies any fevers or chills, chest pain, shortness of breath, or any other new symptoms.  Reports pain at the site.   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 Authorizing Provider  amoxicillin-clavulanate (AUGMENTIN) 875-125 MG tablet Take 1 tablet by mouth every 12 (twelve) hours. 12/31/22   Triplett, Tammy, PA-C  ibuprofen (ADVIL) 800 MG tablet Take 1 tablet (800 mg total) by mouth 3 (three) times daily. Take with food 12/31/22   Triplett, Tammy, PA-C  ondansetron (ZOFRAN ODT) 4 MG disintegrating tablet Take 1 tablet (4 mg total) by mouth every 8 (eight) hours as needed. Patient not taking: Reported on 09/27/2019 03/18/19   Vanetta Mulders, MD  oxyCODONE-acetaminophen (PERCOCET/ROXICET) 5-325 MG tablet Take 1 tablet by mouth every 4 (four) hours as needed. 12/31/22   Triplett, Tammy, PA-C  oxyCODONE-acetaminophen (PERCOCET/ROXICET) 5-325 MG tablet Take 1 tablet by mouth every 6 (six) hours as needed for severe pain. 12/31/22   Pauline Aus, PA-C                                                                                                                                     Past Surgical History History reviewed. No pertinent surgical history. Family History History reviewed. No pertinent family history.  Social History Social History   Tobacco Use   Smoking status: Every Day    Packs/day: 1    Types: Cigarettes   Smokeless tobacco: Never  Vaping Use   Vaping Use: Never used  Substance Use Topics   Alcohol use: Not Currently    Comment: occ   Drug use: Not Currently    Types: IV    Comment: heroin twice a week last used a week ago   Allergies Patient has no known allergies.  Review of Systems Review of Systems  All other systems reviewed and are negative.   Physical Exam Vital Signs  I have reviewed the triage vital signs BP 138/69   Pulse 81   Temp 98 F (36.7 C) (Oral)   Resp  16   Ht 6' (1.829 m)   Wt 110 kg   SpO2 98%   BMI 32.89 kg/m  Physical Exam Vitals and nursing note reviewed.  Constitutional:      General: He is not in acute distress.    Appearance: Normal appearance.  HENT:     Mouth/Throat:     Mouth: Mucous membranes are moist.  Eyes:     Conjunctiva/sclera: Conjunctivae normal.  Cardiovascular:     Rate and Rhythm: Normal rate and regular rhythm.  Pulmonary:     Effort: Pulmonary effort is normal. No respiratory distress.     Breath sounds: Normal breath sounds.  Abdominal:     General: Abdomen is flat.     Palpations: Abdomen is soft.     Tenderness: There is no abdominal tenderness.  Musculoskeletal:     Right lower leg: No edema.     Left lower leg: No edema.  Skin:    Capillary Refill: Capillary refill takes less than 2 seconds.     Comments: Large posterior left calf wound, swelling to the leg.  No crepitus.  Foul smell present.  No active drainage.  Neurological:     Mental Status: He is alert and oriented to person, place, and time. Mental status is at baseline.  Psychiatric:        Mood and Affect: Mood normal.         Behavior: Behavior normal.     ED Results and Treatments Labs (all labs ordered are listed, but only abnormal results are displayed) Labs Reviewed  CBC WITH DIFFERENTIAL/PLATELET - Abnormal; Notable for the following components:      Result Value   Hemoglobin 12.0 (*)    HCT 37.7 (*)    All other components within normal limits  COMPREHENSIVE METABOLIC PANEL - Abnormal; Notable for the following components:   Glucose, Bld 104 (*)    Total Protein 8.2 (*)    AST 42 (*)    All other components within normal limits  CULTURE, BLOOD (ROUTINE X 2)  CULTURE, BLOOD (ROUTINE X 2)  LACTIC ACID, PLASMA                                                                                                                          Radiology CT TIBIA FIBULA LEFT W CONTRAST  Result Date: 01/08/2023 CLINICAL DATA:  Soft tissue infection suspected.  Dog bite. EXAM: CT OF THE LOWER LEFT EXTREMITY WITH CONTRAST TECHNIQUE: Multidetector CT imaging of the lower left extremity was performed according to the standard protocol following intravenous contrast administration. RADIATION DOSE REDUCTION: This exam was performed according to the departmental dose-optimization program which includes automated exposure control, adjustment of the mA and/or kV according to patient size and/or use of iterative reconstruction technique. CONTRAST:  OMNIPAQUE IOHEXOL 300 MG/ML  SOLN COMPARISON:  Left tibia and fibula x-ray 12/31/2022 FINDINGS: Bones/Joint/Cartilage No evidence for fracture, dislocation or arthropathy. No significant joint effusion. Ligaments Suboptimally assessed by  CT. Muscles and Tendons Within normal limits. No intramuscular air or fluid collection identified. Soft tissues There is diffuse subcutaneous edema and swelling of the lower extremity and dorsum of the proximal foot. Superficial laceration is seen in the posterior calf. There is no evidence for foreign body, fluid collection or soft tissue gas.  IMPRESSION: 1. Diffuse subcutaneous edema and swelling of the lower extremity and dorsum of the proximal foot. Superficial laceration is seen in the posterior calf. No evidence for foreign body, fluid collection or soft tissue gas. 2. No acute osseous abnormality. Electronically Signed   By: Darliss Cheney M.D.   On: 01/08/2023 22:08   DG Chest Portable 1 View  Result Date: 01/08/2023 CLINICAL DATA:  Admission.  Infected dog bite. EXAM: PORTABLE CHEST 1 VIEW COMPARISON:  09/27/2019 FINDINGS: Bibasilar linear opacities compatible with atelectasis. Heart and mediastinal contours within normal limits. No effusions or acute bony abnormality. IMPRESSION: Bibasilar atelectasis. Electronically Signed   By: Charlett Nose M.D.   On: 01/08/2023 21:17    Pertinent labs & imaging results that were available during my care of the patient were reviewed by me and considered in my medical decision making (see MDM for details).  Medications Ordered in ED Medications  vancomycin (VANCOREADY) IVPB 1500 mg/300 mL (1,500 mg Intravenous New Bag/Given 01/08/23 2204)  piperacillin-tazobactam (ZOSYN) IVPB 4.5 g (0 g Intravenous Stopped 01/08/23 2253)  iohexol (OMNIPAQUE) 300 MG/ML solution 100 mL (100 mLs Intravenous Contrast Given 01/08/23 2144)                                                                                                                                     Procedures Procedures  (including critical care time)  Medical Decision Making / ED Course   MDM:  42 year old male presenting with wound infection.  Patient overall well-appearing, vitals reassuring, no fever.  Labs with no leukocytosis.  Patient does have large foul-smelling wound to the left posterior calf.  Appears obviously infected.  Patient needs to be admitted for IV antibiotics.  Obtain CT scan which does not show signs of abscess or soft tissue gas.  Given slow progression of symptoms low concern for necrotizing infection.  Ordered  vancomycin and Zosyn.  Will discuss with hospitalist.  Clinical Course as of 01/08/23 2255  Caleen Essex Jan 08, 2023  2253 CT scan of the leg without evidence of abscess or fluid collection.  No free air or gas to suggest necrotizing infection.  Covered with antibiotics.  Will be admitted to hospitalist service.  Discussed with Dr. Thomes Dinning. [WS]    Clinical Course User Index [WS] Lonell Grandchild, MD     Additional history obtained: -Additional history obtained from family -External records from outside source obtained and reviewed including: Chart review including previous notes, labs, imaging, consultation notes including ED  note from 2 days ago   Lab Tests: -I ordered, reviewed, and interpreted labs.   The  pertinent results include:   Labs Reviewed  CBC WITH DIFFERENTIAL/PLATELET - Abnormal; Notable for the following components:      Result Value   Hemoglobin 12.0 (*)    HCT 37.7 (*)    All other components within normal limits  COMPREHENSIVE METABOLIC PANEL - Abnormal; Notable for the following components:   Glucose, Bld 104 (*)    Total Protein 8.2 (*)    AST 42 (*)    All other components within normal limits  CULTURE, BLOOD (ROUTINE X 2)  CULTURE, BLOOD (ROUTINE X 2)  LACTIC ACID, PLASMA    Notable for no leukocytosis, no AKI  EKG   EKG Interpretation Date/Time:  Friday January 08 2023 20:43:59 EDT Ventricular Rate:  86 PR Interval:  158 QRS Duration:  84 QT Interval:  366 QTC Calculation: 438 R Axis:   60  Text Interpretation: Sinus rhythm Confirmed by Alvino Blood ([id number]) on 01/08/2023 10:52:34 PM         Imaging Studies ordered: I ordered imaging studies including CT leg On my interpretation imaging demonstrates no abscess or soft tissue gas I independently visualized and interpreted imaging. I agree with the radiologist interpretation   Medicines ordered and prescription drug management: Meds ordered this encounter  Medications    piperacillin-tazobactam (ZOSYN) IVPB 4.5 g   vancomycin (VANCOREADY) IVPB 1500 mg/300 mL    Order Specific Question:   Indication:    Answer:   Wound Infection   iohexol (OMNIPAQUE) 300 MG/ML solution 100 mL    -I have reviewed the patients home medicines and have made adjustments as needed   Consultations Obtained: I requested consultation with the hospitalist,  and discussed lab and imaging findings as well as pertinent plan - they recommend: admission   Cardiac Monitoring: The patient was maintained on a cardiac monitor.  I personally viewed and interpreted the cardiac monitored which showed an underlying rhythm of: NSR  Social Determinants of Health:  Diagnosis or treatment significantly limited by social determinants of health: current smoker   Reevaluation: After the interventions noted above, I reevaluated the patient and found that their symptoms have improved  Co morbidities that complicate the patient evaluation  Past Medical History:  Diagnosis Date   Substance abuse (HCC)       Dispostion: Disposition decision including need for hospitalization was considered, and patient admitted to the hospital.    Final Clinical Impression(s) / ED Diagnoses Final diagnoses:  Wound infection     This chart was dictated using voice recognition software.  Despite best efforts to proofread,  errors can occur which can change the documentation meaning.    Lonell Grandchild, MD 01/08/23 2255

## 2023-01-08 NOTE — ED Notes (Signed)
Portable X-Ray at bedside  Lab at bedside

## 2023-01-08 NOTE — ED Triage Notes (Addendum)
Pt arrived from home via POV c/o persistent infected dog bite. Pt was advised to be admitted recently when he was here, but reports he had to leave AMA. Pt presents this evening ready to be admitted to receive IV Antibiotics for worsening infection with foul smelling odor and swelling to LLE.

## 2023-01-08 NOTE — Progress Notes (Signed)
Pharmacy Antibiotic Note  Ian Ali is a 42 y.o. male admitted on 01/08/2023 with  wound infection from dog bite. Pharmacy has been consulted for vancomycin dosing. Zosyn x1 ordered by EDP.  Plan: Vancomycin 1500mg  IV q12h - est AUC 485 Follow Cr, LOT, Cx  Height: 6' (182.9 cm) Weight: 110 kg (242 lb 8.1 oz) IBW/kg (Calculated) : 77.6  Temp (24hrs), Avg:97.7 F (36.5 C), Min:97.7 F (36.5 C), Max:97.7 F (36.5 C)  Recent Labs  Lab 01/08/23 2049  WBC 5.4    CrCl cannot be calculated (Patient's most recent lab result is older than the maximum 21 days allowed.).    No Known Allergies  Fredonia Highland, PharmD, BCPS, [facility] Clinical Pharmacist Please check AMION for all [facility] Pharmacy numbers 01/08/2023

## 2023-01-08 NOTE — ED Notes (Signed)
Called AC to get abx dose

## 2023-01-09 DIAGNOSIS — S81832A Puncture wound without foreign body, left lower leg, initial encounter: Secondary | ICD-10-CM | POA: Diagnosis not present

## 2023-01-09 DIAGNOSIS — E669 Obesity, unspecified: Secondary | ICD-10-CM | POA: Insufficient documentation

## 2023-01-09 DIAGNOSIS — F17200 Nicotine dependence, unspecified, uncomplicated: Secondary | ICD-10-CM | POA: Insufficient documentation

## 2023-01-09 LAB — CBC
HCT: 34.9 % — ABNORMAL LOW (ref 39.0–52.0)
Hemoglobin: 11.4 g/dL — ABNORMAL LOW (ref 13.0–17.0)
MCH: 29 pg (ref 26.0–34.0)
MCHC: 32.7 g/dL (ref 30.0–36.0)
MCV: 88.8 fL (ref 80.0–100.0)
Platelets: 187 10*3/uL (ref 150–400)
RBC: 3.93 MIL/uL — ABNORMAL LOW (ref 4.22–5.81)
RDW: 14.4 % (ref 11.5–15.5)
WBC: 6.1 10*3/uL (ref 4.0–10.5)
nRBC: 0 % (ref 0.0–0.2)

## 2023-01-09 LAB — COMPREHENSIVE METABOLIC PANEL
ALT: 32 U/L (ref 0–44)
AST: 39 U/L (ref 15–41)
Albumin: 2.9 g/dL — ABNORMAL LOW (ref 3.5–5.0)
Alkaline Phosphatase: 57 U/L (ref 38–126)
Anion gap: 8 (ref 5–15)
BUN: 16 mg/dL (ref 6–20)
CO2: 25 mmol/L (ref 22–32)
Calcium: 8.5 mg/dL — ABNORMAL LOW (ref 8.9–10.3)
Chloride: 100 mmol/L (ref 98–111)
Creatinine, Ser: 0.71 mg/dL (ref 0.61–1.24)
GFR, Estimated: >60 mL/min (ref >60)
Glucose, Bld: 84 mg/dL (ref 70–99)
Potassium: 4.3 mmol/L (ref 3.5–5.1)
Sodium: 133 mmol/L — ABNORMAL LOW (ref 135–145)
Total Bilirubin: 0.4 mg/dL (ref 0.3–1.2)
Total Protein: 6.9 g/dL (ref 6.5–8.1)

## 2023-01-09 LAB — PHOSPHORUS: Phosphorus: 4.9 mg/dL — ABNORMAL HIGH (ref 2.5–4.6)

## 2023-01-09 LAB — CULTURE, BLOOD (ROUTINE X 2): Special Requests: ADEQUATE

## 2023-01-09 LAB — MAGNESIUM: Magnesium: 2 mg/dL (ref 1.7–2.4)

## 2023-01-09 LAB — HIV ANTIBODY (ROUTINE TESTING W REFLEX): HIV Screen 4th Generation wRfx: NONREACTIVE

## 2023-01-09 MED ORDER — ONDANSETRON HCL 4 MG PO TABS: 4.0000 mg | ORAL_TABLET | Freq: Four times a day (QID) | ORAL | Status: DC | PRN

## 2023-01-09 MED ORDER — ONDANSETRON HCL 4 MG/2ML IJ SOLN: 4.0000 mg | Freq: Four times a day (QID) | INTRAMUSCULAR | Status: DC | PRN

## 2023-01-09 MED ORDER — PIPERACILLIN-TAZOBACTAM 3.375 G IVPB
3.3750 g | Freq: Three times a day (TID) | INTRAVENOUS | Status: DC
  Administered 2023-01-09 – 2023-01-12 (×9): 3.375 g via INTRAVENOUS
  Filled 2023-01-09 (×10): qty 50

## 2023-01-09 MED ORDER — OXYCODONE HCL 5 MG PO TABS
5.0000 mg | ORAL_TABLET | ORAL | Status: DC | PRN
  Administered 2023-01-09 – 2023-01-12 (×7): 5 mg via ORAL
  Filled 2023-01-09 (×7): qty 1

## 2023-01-09 MED ORDER — ACETAMINOPHEN 650 MG RE SUPP: 650.0000 mg | Freq: Four times a day (QID) | RECTAL | Status: DC | PRN

## 2023-01-09 MED ORDER — ACETAMINOPHEN 325 MG PO TABS
650.0000 mg | ORAL_TABLET | Freq: Four times a day (QID) | ORAL | Status: DC | PRN
  Administered 2023-01-09 – 2023-01-11 (×2): 650 mg via ORAL
  Filled 2023-01-09 (×2): qty 2

## 2023-01-09 MED ORDER — ENOXAPARIN SODIUM 40 MG/0.4ML IJ SOSY
40.0000 mg | PREFILLED_SYRINGE | INTRAMUSCULAR | Status: DC
  Filled 2023-01-09 (×4): qty 0.4

## 2023-01-09 NOTE — Progress Notes (Signed)
Conversing with pt when I noticed pt had a small syringe protruding from his pocket. I questioned the pt as to why he has a syringe and if he had injected any substances, pt denied injecting any substance and stated it was his friends insulin needle that he put in his pocket a few days ago and he forgot about it. I confiscated and disposed of said syringe, syringe did not appear to have any substance in it. However the syringe did not have markings consistent with an insulin needle. Dr. Thomes Dinning notified

## 2023-01-09 NOTE — Consult Note (Signed)
WOC Nurse Consult Note: Reason for Consult:Left posterior LE wound from dog bite. Photos from today are compared to photos taken on day of injury, 6/27 Wound type: Trauma, infectious Pressure Injury POA: N/A Measurement:To be obtained by bedside nurse and documented on Nursing Flow Sheet with next dressing change today Wound bed: some edges are loosely approximated with suture, nonviable (necrotic) tissue covers 70% of wound Drainage (amount, consistency, odor) small serous foul odor Periwound:induration, edema, erythema Dressing procedure/placement/frequency: I have provided Nursing with conservative guidance for cleansing and dressing this wound using pure hypochlorous acid (Vashe) as both a cleansing and dressing agent to be performed twice daily. The wound dressing is to be topped with an ABD pad and secured with a few turns of Kerlix roll gauze/.paper tape.  Recommend surgical consult to determine if wound debridement is needed. If you agree, please request/arrange. Any orders provided by surgery will supercede those I have provided today.  WOC nursing team will not follow, but will remain available to this patient, the nursing and medical teams.  Please re-consult if needed.  Thank you for inviting [place] to participate in this patient's Plan of Care.  Ladona Mow, MSN, RN, CNS, GNP, Leda Min, [employer], [employer] phone:  [phone]

## 2023-01-09 NOTE — Progress Notes (Signed)
Patient was in his bathroom smoking cigarettes and spraying lavender glade in an attempt to cover the smell. Floor was slick and covered in glad, floor cleaned up. Cigarettes and lighter in med room and patient educated to no smoking policy.

## 2023-01-09 NOTE — Progress Notes (Signed)
Pharmacy Antibiotic Note  Ian Ali is a 42 y.o. male admitted on 01/08/2023 with  wound infection .  Pharmacy has been consulted for Zosyn dosing.  Plan: Zosyn 3.375g IV q8h (4 hour infusion).  Height: 6' (182.9 cm) Weight: 108.1 kg (238 lb 4.8 oz) IBW/kg (Calculated) : 77.6  Temp (24hrs), Avg:97.8 F (36.6 C), Min:96.3 F (35.7 C), Max:98.7 F (37.1 C)  Recent Labs  Lab 01/08/23 2049 01/09/23 0343  WBC 5.4 6.1  CREATININE 0.82  --   LATICACIDVEN 1.3  --     Estimated Creatinine Clearance: 150.6 mL/min (by C-G formula based on SCr of 0.82 mg/dL).    No Known Allergies   Thank you for allowing pharmacy to be a part of this patient's care.  Vernard Gambles, PharmD, BCPS  01/09/2023 7:01 AM

## 2023-01-09 NOTE — Progress Notes (Signed)
[facility] PROGRESS NOTE  Ian Ali (DOB: 04/11/1981) ZOX:[id number] PCP: Bennie Pierini, FNP  Brief Narrative: Ian Ali is a 42 y.o. male with a history of tobacco use, substance abuse who presented to the ED on 01/08/2023 with worsening pain from left calf wound. He had a dog bite injury 6/27, had loose sutures placed in ED, discharged with augmentin. He returned 7/3 for worsening pain, redness and foul smell. Admission was planned before patient left AMA only to return 7/5 with continued worsening symptoms. CT in the ED showed no abscess or other complication. He was admitted for IV antibiotics with failure of outpatient management.  Subjective: Pain is fine, no complaints.   Objective: BP 139/84   Pulse 89   Temp 97.8 F (36.6 C) (Oral)   Resp 18   Ht 6' (1.829 m)   Wt 108.1 kg   SpO2 100%   BMI 32.32 kg/m   Gen: No distress Pulm: Clear, nonlabored  CV: RRR, no MRG or RLE edema GI: Soft, NT, ND, +BS  Neuro: Alert and oriented. No new focal deficits. Ext: Warm, dry. Wound is V shaped on left posterolateral calf, malodorous with dried blood, mild eschar and slough. Surrounding erythematous induration which is tender without fluctuance. Distal motor and sensation functions intact.   Assessment & Plan: Infected wound from dog bite: No foreign body by scan, no abscess or myositis or bone involvement. Received Tdap 6/27. Dog continues to be monitored and behave normally at this time. - Continue broad coverage with vancomycin and zosyn - Wound care recommendations per WOC, agree, change qshift.  - Monitor blood cultures  Tobacco use:  - Reinforced hospital no smoking policy. Pt declines nicotine patch.   Obesity: Body mass index is 32.32 kg/m.   Ian Nine, MD [facility] [email or web address] 01/09/2023, 4:15 PM

## 2023-01-10 DIAGNOSIS — S81832A Puncture wound without foreign body, left lower leg, initial encounter: Secondary | ICD-10-CM | POA: Diagnosis not present

## 2023-01-10 LAB — CULTURE, BLOOD (ROUTINE X 2): Culture: NO GROWTH

## 2023-01-10 MED ORDER — HYDROMORPHONE HCL 1 MG/ML IJ SOLN
1.0000 mg | Freq: Once | INTRAMUSCULAR | Status: AC
  Administered 2023-01-10: 1 mg via INTRAVENOUS
  Filled 2023-01-10: qty 1

## 2023-01-10 MED ORDER — COLLAGENASE 250 UNIT/GM EX OINT
TOPICAL_OINTMENT | Freq: Every day | CUTANEOUS | Status: DC
  Filled 2023-01-10: qty 30

## 2023-01-10 NOTE — Procedures (Signed)
Bedside procedure note   Preoperative Diagnosis: Left posterior calf wound Postoperative Diagnosis: Left posterior calf wound   Procedure(s) Performed: Sharp excisional debridement of left posterior calf wound   Performing provider: Santina Evans Anupama Piehl, DO   Estimated Blood Loss: Minimal   Findings: Left posterior calf wound measuring 10 x 4 x 0.5 cm at the completion of the procedure (40 cm of sharp excisional debridement), no evidence of subcutaneous infection on debridement   Procedure: At bedside, the left posterior calf wound was prepped with Betadine.  Verbal consent obtained from the patient prior to beginning of procedure.  Timeout was performed.  Previously placed Prolene stitches were first removed.  Using scalpel, necrotic tissue was sharply excised from the left posterior calf wound to bleeding tissue.  The wound was then packed with saline dampened 4 x 4's, covered with dry 4 x 4's and wrapped with Kerlix.  The final measurements of the wound were 10 cm x 4 cm x 0.5 cm.  Patient tolerated the procedure without issue.   Picture of postdebridement left posterior calf wound is in the media tab (patient consented to inclusion of this photo to his medical record).  Theophilus Kinds, DO [facility] [address] [place], [place] [address] [phone] (office)

## 2023-01-10 NOTE — Consult Note (Addendum)
[facility] Consult  Reason for Consult: Left calf wound requiring debridement Referring Physician: Dr. Jarvis Newcomer  Chief Complaint   Wound Infection     HPI: Ian Ali is a 42 y.o. male with concern for infected calf wound.  He was bit by a pit bull 8 days ago (6/27).  He initially presented to the emergency department, at which time they put some loose Prolene stitches in the incision to see if the devascularized skin would heal.  He was discharged home with Augmentin.  He returned to the emergency department on 7/3 for worsening pain, redness, and foul smell, but left AMA prior to evaluation.  He re-presented to the hospital again on 7/5, as he was concerned that the wound was becoming infected.  He also has increased swelling in his calf, surrounding erythema and tenderness.  He underwent a CT of the left lower extremity which demonstrated diffuse subcutaneous edema and swelling of the lower extremity and dorsum of the proximal foot, superficial lacks relation to the posterior calf without foreign body, fluid collection, or soft tissue gas.  He had no leukocytosis upon presentation to the hospital.  He denies any past medical history, and denies use of any medications at this time.  He denies any history of surgeries.  Patient still has pain associated with left lower extremity wound.  He also notes a foul odor.  Denies fevers or chills.  Past Medical History:  Diagnosis Date   Substance abuse (HCC)     History reviewed. No pertinent surgical history.  History reviewed. No pertinent family history.  Social History   Tobacco Use   Smoking status: Every Day    Packs/day: 1    Types: Cigarettes   Smokeless tobacco: Never  Vaping Use   Vaping Use: Never used  Substance Use Topics   Alcohol use: Not Currently    Comment: occ   Drug use: Not Currently    Types: IV    Comment: heroin twice a week last used a week ago    Medications: I have reviewed the patient's  current medications.  No Known Allergies   ROS:  Pertinent items are noted in HPI.  Blood pressure (!) 107/59, pulse 70, temperature 97.6 F (36.4 C), resp. rate 20, height 6' (1.829 m), weight 108.1 kg, SpO2 98 %. Physical Exam Vitals reviewed.  Constitutional:      Appearance: Normal appearance.  HENT:     Head: Normocephalic and atraumatic.  Eyes:     Extraocular Movements: Extraocular movements intact.     Pupils: Pupils are equal, round, and reactive to light.  Cardiovascular:     Rate and Rhythm: Normal rate.  Pulmonary:     Effort: Pulmonary effort is normal.  Abdominal:     General: There is no distension.     Palpations: Abdomen is soft.  Musculoskeletal:        General: Normal range of motion.     Cervical back: Normal range of motion.  Skin:    Comments: Left calf with edema, and erythema surrounding a triangular wound to the posterior, greatest dimensions measuring 10 x 4 cm, devitalized and necrotic skin along superior aspect of wound, with some necrotic fat at the base  Neurological:     General: No focal deficit present.     Mental Status: He is alert and oriented to person, place, and time.  Psychiatric:        Mood and Affect: Mood normal.  Behavior: Behavior normal.   Pre-debridement:   Post-debridement:    Results: Results for orders placed or performed during the hospital encounter of 01/08/23 (from the past 48 hour(s))  CBC with Differential     Status: Abnormal   Collection Time: 01/08/23  8:49 PM  Result Value Ref Range   WBC 5.4 4.0 - 10.5 K/uL   RBC 4.23 4.22 - 5.81 MIL/uL   Hemoglobin 12.0 (L) 13.0 - 17.0 g/dL   HCT 16.1 (L) 09.6 - 04.5 %   MCV 89.1 80.0 - 100.0 fL   MCH 28.4 26.0 - 34.0 pg   MCHC 31.8 30.0 - 36.0 g/dL   RDW 40.9 81.1 - 91.4 %   Platelets 156 150 - 400 K/uL   nRBC 0.0 0.0 - 0.2 %   Neutrophils Relative % 45 %   Neutro Abs 2.4 1.7 - 7.7 K/uL   Lymphocytes Relative 34 %   Lymphs Abs 1.8 0.7 - 4.0 K/uL    Monocytes Relative 11 %   Monocytes Absolute 0.6 0.1 - 1.0 K/uL   Eosinophils Relative 9 %   Eosinophils Absolute 0.5 0.0 - 0.5 K/uL   Basophils Relative 1 %   Basophils Absolute 0.0 0.0 - 0.1 K/uL   Immature Granulocytes 0 %   Abs Immature Granulocytes 0.01 0.00 - 0.07 K/uL    Comment: Performed at [facility], [address], [place], [place] [address]  Comprehensive metabolic panel     Status: Abnormal   Collection Time: 01/08/23  8:49 PM  Result Value Ref Range   Sodium 135 135 - 145 mmol/L   Potassium 4.3 3.5 - 5.1 mmol/L   Chloride 100 98 - 111 mmol/L   CO2 27 22 - 32 mmol/L   Glucose, Bld 104 (H) 70 - 99 mg/dL    Comment: Glucose reference range applies only to samples taken after fasting for at least 8 hours.   BUN 16 6 - 20 mg/dL   Creatinine, Ser 6.21 0.61 - 1.24 mg/dL   Calcium 9.0 8.9 - 30.8 mg/dL   Total Protein 8.2 (H) 6.5 - 8.1 g/dL   Albumin 3.5 3.5 - 5.0 g/dL   AST 42 (H) 15 - 41 U/L   ALT 35 0 - 44 U/L   Alkaline Phosphatase 67 38 - 126 U/L   Total Bilirubin 0.3 0.3 - 1.2 mg/dL   GFR, Estimated >65 >78 mL/min    Comment: (NOTE) Calculated using the CKD-EPI Creatinine Equation (2021)    Anion gap 8 5 - 15    Comment: Performed at [facility], [address] [place], [place] [address]  Lactic acid, plasma     Status: None   Collection Time: 01/08/23  8:49 PM  Result Value Ref Range   Lactic Acid, Venous 1.3 0.5 - 1.9 mmol/L    Comment: Performed at [facility], [address] [place], [place] [address]  Blood culture (routine x 2)     Status: None (Preliminary result)   Collection Time: 01/08/23  8:49 PM   Specimen: Right Antecubital; Blood  Result Value Ref Range   Specimen Description RIGHT ANTECUBITAL    Special Requests      BOTTLES DRAWN AEROBIC AND ANAEROBIC Blood Culture results may not be optimal due to an inadequate volume of blood received in culture bottles   Culture      NO GROWTH 2 DAYS Performed at [facility], [address],  [place], [place] [address]    Report Status PENDING  Blood culture (routine x 2)     Status: None (Preliminary result)   Collection Time: 01/08/23  9:00 PM   Specimen: Left Antecubital; Blood  Result Value Ref Range   Specimen Description LEFT ANTECUBITAL    Special Requests      BOTTLES DRAWN AEROBIC AND ANAEROBIC Blood Culture adequate volume   Culture      NO GROWTH 2 DAYS Performed at [facility], [address] [place], [place] [address]    Report Status PENDING   HIV Antibody (routine testing w rflx)     Status: None   Collection Time: 01/09/23  3:39 AM  Result Value Ref Range   HIV Screen 4th Generation wRfx Non Reactive Non Reactive    Comment: Performed at [facility], [address], [place], [place] [address]  Comprehensive metabolic panel     Status: Abnormal   Collection Time: 01/09/23  3:43 AM  Result Value Ref Range   Sodium 133 (L) 135 - 145 mmol/L   Potassium 4.3 3.5 - 5.1 mmol/L   Chloride 100 98 - 111 mmol/L   CO2 25 22 - 32 mmol/L   Glucose, Bld 84 70 - 99 mg/dL    Comment: Glucose reference range applies only to samples taken after fasting for at least 8 hours.   BUN 16 6 - 20 mg/dL   Creatinine, Ser 0.98 0.61 - 1.24 mg/dL   Calcium 8.5 (L) 8.9 - 10.3 mg/dL   Total Protein 6.9 6.5 - 8.1 g/dL   Albumin 2.9 (L) 3.5 - 5.0 g/dL   AST 39 15 - 41 U/L   ALT 32 0 - 44 U/L   Alkaline Phosphatase 57 38 - 126 U/L   Total Bilirubin 0.4 0.3 - 1.2 mg/dL   GFR, Estimated >11 >91 mL/min    Comment: (NOTE) Calculated using the CKD-EPI Creatinine Equation (2021)    Anion gap 8 5 - 15    Comment: Performed at [facility], [address], [place], [place] [address]  CBC     Status: Abnormal   Collection Time: 01/09/23  3:43 AM  Result Value Ref Range   WBC 6.1 4.0 - 10.5 K/uL   RBC 3.93 (L) 4.22 - 5.81 MIL/uL   Hemoglobin 11.4 (L) 13.0 - 17.0 g/dL   HCT 56.2 (L) 13.0 - 86.5 %   MCV 88.8 80.0 - 100.0 fL   MCH 29.0 26.0 - 34.0 pg   MCHC 32.7 30.0 - 36.0 g/dL    RDW 78.4 69.6 - 29.5 %   Platelets 187 150 - 400 K/uL   nRBC 0.0 0.0 - 0.2 %    Comment: Performed at [facility], [address], [place], [place] [address]  Magnesium     Status: None   Collection Time: 01/09/23  3:43 AM  Result Value Ref Range   Magnesium 2.0 1.7 - 2.4 mg/dL    Comment: Performed at [facility], [address], [place], [place] [address]  Phosphorus     Status: Abnormal   Collection Time: 01/09/23  3:43 AM  Result Value Ref Range   Phosphorus 4.9 (H) 2.5 - 4.6 mg/dL    Comment: Performed at [facility], [address], [place], [place] [address]    CT TIBIA FIBULA LEFT W CONTRAST  Result Date: 01/08/2023 CLINICAL DATA:  Soft tissue infection suspected.  Dog bite. EXAM: CT OF THE LOWER LEFT EXTREMITY WITH CONTRAST TECHNIQUE: Multidetector CT imaging of the lower left extremity was performed according to  the standard protocol following intravenous contrast administration. RADIATION DOSE REDUCTION: This exam was performed according to the departmental dose-optimization program which includes automated exposure control, adjustment of the mA and/or kV according to patient size and/or use of iterative reconstruction technique. CONTRAST:  OMNIPAQUE IOHEXOL 300 MG/ML  SOLN COMPARISON:  Left tibia and fibula x-ray 12/31/2022 FINDINGS: Bones/Joint/Cartilage No evidence for fracture, dislocation or arthropathy. No significant joint effusion. Ligaments Suboptimally assessed by CT. Muscles and Tendons Within normal limits. No intramuscular air or fluid collection identified. Soft tissues There is diffuse subcutaneous edema and swelling of the lower extremity and dorsum of the proximal foot. Superficial laceration is seen in the posterior calf. There is no evidence for foreign body, fluid collection or soft tissue gas. IMPRESSION: 1. Diffuse subcutaneous edema and swelling of the lower extremity and dorsum of the proximal foot. Superficial laceration is seen in the posterior calf. No  evidence for foreign body, fluid collection or soft tissue gas. 2. No acute osseous abnormality. Electronically Signed   By: Darliss Cheney M.D.   On: 01/08/2023 22:08   DG Chest Portable 1 View  Result Date: 01/08/2023 CLINICAL DATA:  Admission.  Infected dog bite. EXAM: PORTABLE CHEST 1 VIEW COMPARISON:  09/27/2019 FINDINGS: Bibasilar linear opacities compatible with atelectasis. Heart and mediastinal contours within normal limits. No effusions or acute bony abnormality. IMPRESSION: Bibasilar atelectasis. Electronically Signed   By: Charlett Nose M.D.   On: 01/08/2023 21:17     Assessment & Plan:  Ian Ali is a 42 y.o. male who was admitted with an infected left calf wound with associated cellulitis.  Imaging and blood work evaluated by myself.  -I explained to the patient that the stitches that were placed in the emergency department originally, are serving no function at this time.  In addition, he has an area of skin that has died and needs to be excised to allow the area to heal appropriately -The risk and benefits of debridement of left calf wound were discussed including but not limited to bleeding, infection, and need for additional debridements.  After careful consideration, Ian Ali has decided to proceed with bedside debridement. -1 mg IV Dilaudid given prior to debridement -Please refer to separate dictation for details of procedure -Daily dressing changes with Santyl, damp 4 x 4's, dry 4 x 4's, and Kerlix wrap. Wound care orders placed -Patient will need to follow with a wound clinic for further management of the wound upon discharge -IV antibiotics per primary team -Care per primary team  All questions were answered to the satisfaction of the patient.  -- Theophilus Kinds, DO [facility] [address] [place], [place] [address] [phone] (office)

## 2023-01-10 NOTE — Progress Notes (Signed)
[facility] PROGRESS NOTE  Ian Ali (DOB: [birth year]) WJX:[id number] PCP: Bennie Pierini, FNP  Brief Narrative: Ian Ali is a 42 y.o. male with a history of tobacco use, substance abuse who presented to the ED on 01/08/2023 with worsening pain from left calf wound. He had a dog bite injury 6/27, had loose sutures placed in ED, discharged with augmentin. He returned 7/3 for worsening pain, redness and foul smell. Admission was planned before patient left AMA only to return 7/5 with continued worsening symptoms. CT in the ED showed no abscess or other complication. He was admitted for IV antibiotics with failure of outpatient management.  Subjective: Last pain medication around midnight, beginning to need it again now. No fevers, no other complaints. Overnight events noted.  Objective: BP (!) 107/59 (BP Location: Right Arm)   Pulse 70   Temp 97.6 F (36.4 C)   Resp 20   Ht 6' (1.829 m)   Wt 108.1 kg   SpO2 98%   BMI 32.32 kg/m   Gen: No distress Pulm: Nonlabored   Neuro: Alert and oriented. No new focal deficits. Skin: LLE calf wound relatively unchanged with surrounding induration, no palpable fluctuance, wound edges loosely approximated with prolene sutures, wound bed with increased slough.    Assessment & Plan: Infected wound from dog bite: No foreign body by scan, no abscess or myositis or bone involvement. Received Tdap 6/27. Dog continues to be monitored and behave normally at this time. - Continue broad coverage with vancomycin and zosyn pending cultures - Wound care recommendations per WOC. The amount of slough and necrotic areas argues that debridement would be helpful. will reach out to surgery.  - Monitor blood cultures (NGTD) - Pain control with oral agents only.   Polysubstance abuse: Also was found with a syringe which has been confiscated.  - Avoid IV narcotics - HIV checked, nonreactive - No evidence of withdrawal or intoxication at this time  Tobacco  use:  - Reinforced hospital no smoking policy. Pt declines nicotine patch.   Obesity: Body mass index is 32.32 kg/m.   Tyrone Nine, MD [facility] [email or web address] 01/10/2023, 10:43 AM

## 2023-01-11 DIAGNOSIS — S81832A Puncture wound without foreign body, left lower leg, initial encounter: Secondary | ICD-10-CM | POA: Diagnosis not present

## 2023-01-11 LAB — CULTURE, BLOOD (ROUTINE X 2): Culture: NO GROWTH

## 2023-01-11 NOTE — Discharge Instructions (Signed)
Wound Care Instructions:  -Change dressing daily.  Recommend changing dressing after bathing -Ok to shower.  Let soapy water run overtop of the wound. Pat dry after bathing. -Apply Santyl to left calf wound daily (nickel thickness overlying wound) -Cover with damp 4x4s (dampen with saline or water), dry 4x4s, and wrap with gauze roll. Secure kerlix with tape.

## 2023-01-11 NOTE — Progress Notes (Signed)
[facility] PROGRESS NOTE  Ian Ali (DOB: 05/06/1981) ZOX:[id number] PCP: Bennie Pierini, FNP  Brief Narrative: Ian Ali is a 42 y.o. male with a history of tobacco use, substance abuse who presented to the ED on 01/08/2023 with worsening pain from left calf wound. He had a dog bite injury 6/27, had loose sutures placed in ED, discharged with augmentin. He returned 7/3 for worsening pain, redness and foul smell. Admission was planned before patient left AMA only to return 7/5 with continued worsening symptoms. CT in the ED showed no abscess or other complication. He was admitted for IV antibiotics with failure of outpatient management. Bedside debridement carried out 7/7.   Subjective: Pain control adequate, feels swelling is decreasing. No fevers.   Objective: BP 126/79 (BP Location: Left Arm)   Pulse 83   Temp 98.4 F (36.9 C) (Oral)   Resp 20   Ht 6' (1.829 m)   Wt 108.1 kg   SpO2 98%   BMI 32.32 kg/m   No distress Nonlabored RRR LLE with much improvement in induration and erythema around V shaped wound with packing in place. No new findings, no fluctuance.   Assessment & Plan: Infected wound from dog bite: No foreign body by scan, no abscess or myositis or bone involvement. Received Tdap 6/27. Dog continues to be monitored and behave normally at this time. - Continue broad coverage with vancomycin and zosyn pending cultures (remain NGTD today). May need to DC pt w/broad coverage including MRSA.  - s/p bedside debridement 7/7 by Dr. Robyne Peers. Continue santyl and W > D gauze packing covered with ABD and wrap with kerlix twice daily. Will have wound care training for patient today in preparation for discharge tomorrow.  - Pain control with oral agents only.   Polysubstance abuse: Also was found with a syringe which has been confiscated.  - Avoid IV narcotics - HIV checked, nonreactive - No evidence of withdrawal or intoxication at this time  Tobacco use:  -  Reinforced hospital no smoking policy. Pt declines nicotine patch.   Obesity: Body mass index is 32.32 kg/m.   Tyrone Nine, MD [facility] [email or web address] 01/11/2023, 9:44 AM

## 2023-01-11 NOTE — Progress Notes (Signed)
[facility] Progress Note     Subjective: Patient seen and examined.  He is resting comfortably in bed.  He feels that the swelling to his left lower extremity is improved, and the foul odor from his leg has also resolved.  He has no complaints at this time.  Objective: Vital signs in last 24 hours: Temp:  [98.1 F (36.7 C)-98.4 F (36.9 C)] 98.4 F (36.9 C) (07/07 2127) Pulse Rate:  [72-83] 83 (07/07 2127) Resp:  [18-20] 20 (07/07 2127) BP: (126-150)/(79-99) 126/79 (07/07 2127) SpO2:  [98 %-100 %] 98 % (07/07 2127) Last BM Date : 01/08/23  Intake/Output from previous day: 07/07 0701 - 07/08 0700 In: 984.5 [P.O.:640; IV Piggyback:344.5] Out: -  Intake/Output this shift: Total I/O In: 300 [IV Piggyback:300] Out: -   General appearance: alert, cooperative, and no distress Skin: Left calf with improving edema, and erythema surrounding a triangular wound to the posterior, greatest dimensions measuring 10 x 4 cm, some pink granulation tissue along edges with some necrotic fat at the base, looks improved from post debridement photo   Lab Results:  Recent Labs    01/08/23 2049 01/09/23 0343  WBC 5.4 6.1  HGB 12.0* 11.4*  HCT 37.7* 34.9*  PLT 156 187   BMET Recent Labs    01/08/23 2049 01/09/23 0343  NA 135 133*  K 4.3 4.3  CL 100 100  CO2 27 25  GLUCOSE 104* 84  BUN 16 16  CREATININE 0.82 0.71  CALCIUM 9.0 8.5*   PT/INR No results for input(s): "LABPROT", "INR" in the last 72 hours.  Studies/Results: No results found.  Anti-infectives: Anti-infectives (From admission, onward)    Start     Dose/Rate Route Frequency Ordered Stop   01/09/23 0800  piperacillin-tazobactam (ZOSYN) IVPB 3.375 g        3.375 g 12.5 mL/hr over 240 Minutes Intravenous Every 8 hours 01/09/23 0702     01/08/23 2145  vancomycin (VANCOREADY) IVPB 1500 mg/300 mL        1,500 mg 150 mL/hr over 120 Minutes Intravenous Every 12 hours 01/08/23 2133     01/08/23 2115   piperacillin-tazobactam (ZOSYN) IVPB 4.5 g        4.5 g 200 mL/hr over 30 Minutes Intravenous  Once 01/08/23 2112 01/08/23 2253       Assessment/Plan:  Patient is a 42 y.o. male who was admitted with an infected left calf wound with associated cellulitis.  Imaging and blood work evaluated by myself.   -Wound is looking slightly improved even from yesterday with the use of Santyl -Continue with daily dressing changes with Santyl, damp 4 x 4's, dry 4 x 4's, and Kerlix wrap. Wound care orders placed -Patient will need to follow with a wound clinic for further management of the wound upon discharge. May need home health care to assist with dressing changes -IV antibiotics per primary team -Care per primary team -Stable for discharge from surgical standpoint   All questions were answered to the satisfaction of the patient.   LOS: 3 days    Nina Hoar A Blessing Zaucha 01/11/2023

## 2023-01-12 DIAGNOSIS — S81832A Puncture wound without foreign body, left lower leg, initial encounter: Secondary | ICD-10-CM | POA: Diagnosis not present

## 2023-01-12 MED ORDER — COLLAGENASE 250 UNIT/GM EX OINT: TOPICAL_OINTMENT | Freq: Every day | CUTANEOUS | 0 refills | Status: AC

## 2023-01-12 MED ORDER — DOXYCYCLINE HYCLATE 100 MG PO TABS
100.0000 mg | ORAL_TABLET | Freq: Two times a day (BID) | ORAL | Status: DC
  Administered 2023-01-12: 100 mg via ORAL
  Filled 2023-01-12: qty 1

## 2023-01-12 MED ORDER — DOXYCYCLINE MONOHYDRATE 100 MG PO CAPS: 100.0000 mg | ORAL_CAPSULE | Freq: Two times a day (BID) | ORAL | 0 refills | Status: AC

## 2023-01-12 MED ORDER — AMOXICILLIN-POT CLAVULANATE 875-125 MG PO TABS: 1.0000 | ORAL_TABLET | Freq: Two times a day (BID) | ORAL | 0 refills | Status: AC

## 2023-01-12 MED ORDER — AMOXICILLIN-POT CLAVULANATE 875-125 MG PO TABS
1.0000 | ORAL_TABLET | Freq: Two times a day (BID) | ORAL | Status: DC
  Administered 2023-01-12: 1 via ORAL
  Filled 2023-01-12: qty 1

## 2023-01-12 NOTE — Discharge Summary (Signed)
Physician Discharge Summary   Patient: Ian Ali MRN: [id number] DOB: 05/06/1981  Admit date:     01/08/2023  Discharge date: 01/12/23  Discharge Physician: Tyrone Nine   PCP: Bennie Pierini, FNP   Recommendations at discharge:  Follow up with wound care center for left calf wound s/p dog bite discharged with improving wound s/p bedside debridement and on doxycycline, augmentin.   Discharge Diagnoses: Principal Problem:   Puncture wound of left calf Active Problems:   Tobacco use disorder   Obesity (BMI 30-39.9)  Hospital Course: Ian Ali is a 42 y.o. male with a history of tobacco use, substance abuse who presented to the ED on 01/08/2023 with worsening pain from left calf wound. He had a dog bite injury 6/27, had loose sutures placed in ED, discharged with augmentin. He returned 7/3 for worsening pain, redness and foul smell. Admission was planned before patient left AMA only to return 7/5 with continued worsening symptoms. CT in the ED showed no abscess or other complication. He was admitted for IV antibiotics with failure of outpatient management. Bedside debridement carried out 7/7, subsequently discharged on oral antibiotics 7/9 after wound care teaching provided and wound care referral made.  Assessment and Plan: Infected wound from dog bite: No foreign body by scan, no abscess or myositis or bone involvement. Received Tdap 6/27. Dog continues to be monitored and behave normally at this time. - Continue broad coverage with vancomycin and zosyn pending cultures (remain NGTD today). Will DC w/augmentin and addition of doxycycline for MRSA coverage.  - s/p bedside debridement 7/7 by Dr. Robyne Peers. Continue santyl and W > D gauze packing covered with ABD and wrap with kerlix twice daily.      Polysubstance abuse: Also was found with a syringe which has been confiscated.  - Avoid IV narcotics - HIV checked, nonreactive - No evidence of withdrawal or intoxication at this time    Tobacco use:  - Reinforced hospital no smoking policy. Pt declines nicotine patch.    Obesity: Body mass index is 32.32 kg/m.   Consultants: Surgery Procedures performed: I&D  Disposition: Home Diet recommendation: Regular DISCHARGE MEDICATION: Allergies as of 01/12/2023   No Known Allergies      Medication List     TAKE these medications    amoxicillin-clavulanate 875-125 MG tablet Commonly known as: AUGMENTIN Take 1 tablet by mouth every 12 (twelve) hours.   collagenase 250 UNIT/GM ointment Commonly known as: SANTYL Apply topically daily. Start taking on: January 13, 2023   doxycycline 100 MG capsule Commonly known as: MONODOX Take 1 capsule (100 mg total) by mouth 2 (two) times daily for 7 days.   ibuprofen 800 MG tablet Commonly known as: ADVIL Take 1 tablet (800 mg total) by mouth 3 (three) times daily. Take with food   oxyCODONE-acetaminophen 5-325 MG tablet Commonly known as: PERCOCET/ROXICET Take 1 tablet by mouth every 4 (four) hours as needed.               Discharge Care Instructions  (From admission, onward)           Start     Ordered   01/12/23 0000  Discharge wound care:       Comments: Apply Santyl to left calf wound daily (nickel thickness overlying wound).  Cover with damp 4x4s, dry 4x4s, and wrap with kerlix. Secure kerlix with tape twice per day.   01/12/23 0840            Follow-up Information  Pappayliou, Gustavus Messing, DO. Call.   Specialty: General Surgery Why: As needed Contact information: [address] [place] [facility] [address] [phone]         Bennie Pierini, FNP Follow up.   Specialty: Family Medicine Contact information: [address] [place] [address] [phone]                Discharge Exam: Ceasar Mons Weights   01/08/23 2013 01/08/23 2343  Weight: 110 kg 108.1 kg  BP 127/77 (BP Location: Right Arm)   Pulse 66   Temp 98.9 F (37.2 C)   Resp 18   Ht 6' (1.829 m)    Wt 108.1 kg   SpO2 100%   BMI 32.32 kg/m   Well-appearing male in no distress Left posterior calf V shaped wound with some slough, no exudate, no necrotic areas, good granulation tissue, mild periwound induration and erythema continues improving day by day.   Condition at discharge: stable  The results of significant diagnostics from this hospitalization (including imaging, microbiology, ancillary and laboratory) are listed below for reference.   Imaging Studies: CT TIBIA FIBULA LEFT W CONTRAST  Result Date: 01/08/2023 CLINICAL DATA:  Soft tissue infection suspected.  Dog bite. EXAM: CT OF THE LOWER LEFT EXTREMITY WITH CONTRAST TECHNIQUE: Multidetector CT imaging of the lower left extremity was performed according to the standard protocol following intravenous contrast administration. RADIATION DOSE REDUCTION: This exam was performed according to the departmental dose-optimization program which includes automated exposure control, adjustment of the mA and/or kV according to patient size and/or use of iterative reconstruction technique. CONTRAST:  OMNIPAQUE IOHEXOL 300 MG/ML  SOLN COMPARISON:  Left tibia and fibula x-ray 12/31/2022 FINDINGS: Bones/Joint/Cartilage No evidence for fracture, dislocation or arthropathy. No significant joint effusion. Ligaments Suboptimally assessed by CT. Muscles and Tendons Within normal limits. No intramuscular air or fluid collection identified. Soft tissues There is diffuse subcutaneous edema and swelling of the lower extremity and dorsum of the proximal foot. Superficial laceration is seen in the posterior calf. There is no evidence for foreign body, fluid collection or soft tissue gas. IMPRESSION: 1. Diffuse subcutaneous edema and swelling of the lower extremity and dorsum of the proximal foot. Superficial laceration is seen in the posterior calf. No evidence for foreign body, fluid collection or soft tissue gas. 2. No acute osseous abnormality. Electronically  Signed   By: Darliss Cheney M.D.   On: 01/08/2023 22:08   DG Chest Portable 1 View  Result Date: 01/08/2023 CLINICAL DATA:  Admission.  Infected dog bite. EXAM: PORTABLE CHEST 1 VIEW COMPARISON:  09/27/2019 FINDINGS: Bibasilar linear opacities compatible with atelectasis. Heart and mediastinal contours within normal limits. No effusions or acute bony abnormality. IMPRESSION: Bibasilar atelectasis. Electronically Signed   By: Charlett Nose M.D.   On: 01/08/2023 21:17   DG Tibia/Fibula Left  Result Date: 12/31/2022 CLINICAL DATA:  Dog bite to the left lower extremity. EXAM: LEFT TIBIA AND FIBULA - 2 VIEW COMPARISON:  Left ankle radiograph dated 10/15/2003. FINDINGS: No acute fracture or dislocation. The bones are well mineralized. Mild narrowing of the lateral compartment of the knee. There is laceration of the skin and soft tissues of the posterior calf. No radiopaque foreign object. IMPRESSION: 1. No acute fracture or dislocation. 2. Laceration of the posterior calf. Electronically Signed   By: Elgie Collard M.D.   On: 12/31/2022 21:46    Microbiology: Results for orders placed or performed during the hospital encounter of 01/08/23  Blood culture (routine x 2)  Status: None (Preliminary result)   Collection Time: 01/08/23  8:49 PM   Specimen: Right Antecubital; Blood  Result Value Ref Range Status   Specimen Description RIGHT ANTECUBITAL  Final   Special Requests   Final    BOTTLES DRAWN AEROBIC AND ANAEROBIC Blood Culture results may not be optimal due to an inadequate volume of blood received in culture bottles   Culture   Final    NO GROWTH 4 DAYS Performed at [facility], [address], [place], [place] [address]    Report Status PENDING  Incomplete  Blood culture (routine x 2)     Status: None (Preliminary result)   Collection Time: 01/08/23  9:00 PM   Specimen: Left Antecubital; Blood  Result Value Ref Range Status   Specimen Description LEFT ANTECUBITAL  Final   Special  Requests   Final    BOTTLES DRAWN AEROBIC AND ANAEROBIC Blood Culture adequate volume   Culture   Final    NO GROWTH 4 DAYS Performed at [facility], [address], [place], [place] [address]    Report Status PENDING  Incomplete    Labs: CBC: Recent Labs  Lab 01/08/23 2049 01/09/23 0343  WBC 5.4 6.1  NEUTROABS 2.4  --   HGB 12.0* 11.4*  HCT 37.7* 34.9*  MCV 89.1 88.8  PLT 156 187   Basic Metabolic Panel: Recent Labs  Lab 01/08/23 2049 01/09/23 0343  NA 135 133*  K 4.3 4.3  CL 100 100  CO2 27 25  GLUCOSE 104* 84  BUN 16 16  CREATININE 0.82 0.71  CALCIUM 9.0 8.5*  MG  --  2.0  PHOS  --  4.9*   Liver Function Tests: Recent Labs  Lab 01/08/23 2049 01/09/23 0343  AST 42* 39  ALT 35 32  ALKPHOS 67 57  BILITOT 0.3 0.4  PROT 8.2* 6.9  ALBUMIN 3.5 2.9*   CBG: No results for input(s): "GLUCAP" in the last 168 hours.  Discharge time spent: greater than 30 minutes.  Signed: Tyrone Nine, MD [facility] 01/12/2023

## 2023-01-12 NOTE — TOC Transition Note (Signed)
Transition of Care [facility]) - CM/SW Discharge Note   Patient Details  Name: Ian Ali MRN: [id number] Date of Birth: 1980-10-02  [facility] of [facility] CM/SW Contact:  Elliot Gault, LCSW Phone Number: 01/12/2023, 8:58 AM   Clinical Narrative:     Pt medically stable for dc per MD. Received TOC consult for SA treatment resources and outpatient wound clinic referral.  Spoke with pt to address consults. CMS provider options reviewed. Resources added to AVS. Referral for OP wound clinic sent to [facility] outpatient wound clinic.  No other TOC needs.  Final next level of care: Other (comment) (outpatient wound clinic) Barriers to Discharge: Barriers Resolved   Patient Goals and CMS Choice CMS Medicare.gov Compare Post Acute Care list provided to:: Patient Choice offered to / list presented to : Patient  Discharge Placement                         Discharge Plan and Services Additional resources added to the After Visit Summary for                                       Social Determinants of Health (SDOH) Interventions SDOH Screenings   Tobacco Use: High Risk (01/08/2023)     Readmission Risk Interventions     No data to display

## 2023-01-12 NOTE — Progress Notes (Signed)
Patient discharged home today, transported home by family. Discharge summary went over with patient by [facility]. Patient demonstrated how to change dressing to left lower leg. Patient provided supplies to clear and dress wound to left lower leg. Patients belongings sent home with patient.

## 2023-01-15 ENCOUNTER — Other Ambulatory Visit: Payer: Self-pay

## 2023-01-15 ENCOUNTER — Telehealth: Payer: Self-pay | Admitting: *Deleted

## 2023-01-15 ENCOUNTER — Encounter (HOSPITAL_COMMUNITY): Payer: Self-pay | Admitting: Emergency Medicine

## 2023-01-15 ENCOUNTER — Emergency Department (HOSPITAL_COMMUNITY)
Admission: EM | Admit: 2023-01-15 | Discharge: 2023-01-16 | Disposition: A | Discharge status: Home / Self Care | Payer: 59 | Attending: Emergency Medicine | Admitting: Emergency Medicine

## 2023-01-15 DIAGNOSIS — L0889 Other specified local infections of the skin and subcutaneous tissue: Secondary | ICD-10-CM | POA: Diagnosis not present

## 2023-01-15 DIAGNOSIS — L089 Local infection of the skin and subcutaneous tissue, unspecified: Secondary | ICD-10-CM

## 2023-01-15 MED ORDER — COLLAGENASE 250 UNIT/GM EX OINT
TOPICAL_OINTMENT | Freq: Every day | CUTANEOUS | Status: DC
  Filled 2023-01-15: qty 30

## 2023-01-15 MED ORDER — CEFTRIAXONE SODIUM 1 G IJ SOLR
1.0000 g | Freq: Once | INTRAMUSCULAR | Status: AC
  Administered 2023-01-16: 1 g via INTRAMUSCULAR
  Filled 2023-01-15: qty 10

## 2023-01-15 MED ORDER — HYDROCODONE-ACETAMINOPHEN 5-325 MG PO TABS
2.0000 | ORAL_TABLET | Freq: Once | ORAL | Status: AC
  Administered 2023-01-16: 2 via ORAL
  Filled 2023-01-15: qty 2

## 2023-01-15 NOTE — Telephone Encounter (Signed)
Surgical Date: 01/10/2023 Procedure: Lambert Mody excisional debridement of left posterior calf wound   Received call from patient mother, Eber Jones [phone]~ telephone.   Reports that patient is unable to obtain cream for wound care, and he does not have any upcoming appointments for wound care. Inquired as to next steps.   Wound care orders are as follows:  daily dressing changes with Santyl, damp 4 x 4's, dry 4 x 4's, and Kerlix wrap   Advised that patient insurance (medicaid) does not cover Santyl. No other creams are comparable to Santyl. Advised to follow other instructions for wet to dry dressing.   Advised that orders were sent to Outpatient Rehab for wound care. Advised that referral was closed as patient declined in patient appointment in favor of requesting home health. No orders placed for home health nursing from hospital.   Advised that home health may be an option, but as orders have not been placed at this time, start of care would be delayed. Advised to contact [facility] at [phone] telephone to schedule wound care appointment. Also advised that Outpatient Rehab may be able to give samples of Santyl or determine if another cream would be appropriate.   Patient has upcoming appointment with [facility] to establish as a new patient.

## 2023-01-15 NOTE — ED Triage Notes (Addendum)
Pt with c/o infected dog bite. States was seen here and admitted for four days for the same on July 5th. Pt states he is currently being treated with an Antibiotic which he has not finished yet.

## 2023-01-15 NOTE — ED Provider Notes (Signed)
EMERGENCY DEPARTMENT AT [facility] Provider Note   CSN: [id number] Arrival date & time: 01/15/23  2243     History  Chief Complaint  Patient presents with   Infected Dog Bite    Ian Ali is a 42 y.o. male.  Patient is a 42 year old male presenting with complaints of increased pain and swelling at his wound site.  Patient was bit on the calf by a dog 2 weeks ago.  He was admitted at [facility] for several days and received IV antibiotics along with wound care.  He was discharged on doxycycline and Augmentin which she reports he has been taking.  He was also supposed to be using Santyl, however this was not approved by his insurance, so has not been using it.  He patient presenting here with discomfort around the wound site and increasing pain.  He denies any fevers or chills.  The history is provided by the patient.       Home Medications Prior to Admission medications   Medication Sig Start Date End Date Taking? Authorizing Provider  amoxicillin-clavulanate (AUGMENTIN) 875-125 MG tablet Take 1 tablet by mouth every 12 (twelve) hours. 01/12/23   Tyrone Nine, MD  collagenase (SANTYL) 250 UNIT/GM ointment Apply topically daily. 01/13/23   Tyrone Nine, MD  doxycycline (MONODOX) 100 MG capsule Take 1 capsule (100 mg total) by mouth 2 (two) times daily for 7 days. 01/12/23 01/19/23  Tyrone Nine, MD  ibuprofen (ADVIL) 800 MG tablet Take 1 tablet (800 mg total) by mouth 3 (three) times daily. Take with food 12/31/22   Triplett, Tammy, PA-C  oxyCODONE-acetaminophen (PERCOCET/ROXICET) 5-325 MG tablet Take 1 tablet by mouth every 4 (four) hours as needed. Patient not taking: Reported on 01/10/2023 12/31/22   Pauline Aus, PA-C      Allergies    Patient has no known allergies.    Review of Systems   Review of Systems  All other systems reviewed and are negative.   Physical Exam Updated Vital Signs BP 119/70 (BP Location: Right Arm)   Pulse 83   Temp 98.2 F  (36.8 C) (Oral)   Resp 18   Ht 6\' 1"  (1.854 m)   Wt 108 kg   SpO2 99%   BMI 31.41 kg/m  Physical Exam Vitals and nursing note reviewed.  Constitutional:      Appearance: Normal appearance.  Pulmonary:     Effort: Pulmonary effort is normal.  Skin:    General: Skin is warm and dry.     Comments: To the posterior aspect of the left leg, just below the left calf, there is a large wound noted.  There is granulation tissue present with surrounding induration, but no purulent drainage.  Neurological:     Mental Status: He is alert and oriented to person, place, and time.     ED Results / Procedures / Treatments   Labs (all labs ordered are listed, but only abnormal results are displayed) Labs Reviewed - No data to display  EKG None  Radiology No results found.  Procedures Procedures    Medications Ordered in ED Medications - No data to display  ED Course/ Medical Decision Making/ A&P  Patient presenting here with complaints of a wound follow-up.  He had a dog bite to the left calf several weeks ago and was admitted to the hospital for IV antibiotics.  He presents here with increasing pain and concerned that the wound may be worsening.  On physical  exam, there appears to be granulation tissue with no purulent drainage.  I see no surrounding erythema or warmth that would be bothersome of infection.  Patient was given a dose of Rocephin.  Santyl also applied as he was to be using this at home, however his insurance did not approve this.  I do not see an indication for admission.  I feel as though patient can be discharged with continued use of oral antibiotics and as needed return.  Final Clinical Impression(s) / ED Diagnoses Final diagnoses:  None    Rx / DC Orders ED Discharge Orders     None         Geoffery Lyons, MD 01/16/23 [phone]

## 2023-01-16 DIAGNOSIS — L0889 Other specified local infections of the skin and subcutaneous tissue: Secondary | ICD-10-CM | POA: Diagnosis not present

## 2023-01-16 MED ORDER — STERILE WATER FOR INJECTION IJ SOLN
INTRAMUSCULAR | Status: AC
  Administered 2023-01-16: 10 mL via INTRAMUSCULAR
  Filled 2023-01-16: qty 10

## 2023-01-16 MED ORDER — HYDROCODONE-ACETAMINOPHEN 5-325 MG PO TABS: 2.0000 | ORAL_TABLET | ORAL | 0 refills | Status: AC | PRN

## 2023-01-16 MED ORDER — STERILE WATER FOR INJECTION IJ SOLN: 10.0000 mL | Freq: Once | INTRAMUSCULAR | Status: AC

## 2023-01-16 MED ORDER — COLLAGENASE 250 UNIT/GM EX OINT
TOPICAL_OINTMENT | Freq: Once | CUTANEOUS | Status: AC
  Administered 2023-01-16: 1 via TOPICAL
  Filled 2023-01-16: qty 30

## 2023-01-16 NOTE — Discharge Instructions (Addendum)
Continue antibiotics as previously prescribed.  Follow-up with your primary doctor on Monday as scheduled.  Take hydrocodone as prescribed as needed for pain.

## 2023-01-18 ENCOUNTER — Ambulatory Visit: Payer: MEDICAID | Admitting: Nurse Practitioner

## 2023-01-18 ENCOUNTER — Encounter: Payer: Self-pay | Admitting: Nurse Practitioner

## 2023-01-18 ENCOUNTER — Ambulatory Visit (INDEPENDENT_AMBULATORY_CARE_PROVIDER_SITE_OTHER): Payer: 59 | Admitting: Nurse Practitioner

## 2023-01-18 VITALS — BP 134/85 | HR 79 | Temp 97.8°F | Resp 20 | Ht 73.0 in | Wt 224.0 lb

## 2023-01-18 DIAGNOSIS — S81852D Open bite, left lower leg, subsequent encounter: Secondary | ICD-10-CM

## 2023-01-18 DIAGNOSIS — S81852A Open bite, left lower leg, initial encounter: Secondary | ICD-10-CM

## 2023-01-18 DIAGNOSIS — W540XXA Bitten by dog, initial encounter: Secondary | ICD-10-CM | POA: Diagnosis not present

## 2023-01-18 MED FILL — Hydrocodone-Acetaminophen Tab 5-325 MG: ORAL | Qty: 6 | Status: AC

## 2023-01-18 NOTE — Patient Instructions (Signed)

## 2023-01-18 NOTE — Progress Notes (Signed)
   Subjective:    Patient ID: Ian Ali, male    DOB: [birth year], 42 y.o.   MRN: [id number]   Chief Complaint: hospital follow up   Patient was seen in ED on 01/15/23 with dog bite. Patient was bitten on calf by a dog 2 weeks prior. He was admitted then to [facility] for IV antibiotics. He was discharged on doxy and augmentin. When seen in ED he was still having lots of pain and swelling. He was given a shot of rocephin in the ED and was told to follow up with PCP.  Patient Active Problem List   Diagnosis Date Noted   Puncture wound of left calf 01/09/2023   Tobacco use disorder 01/09/2023   Obesity (BMI 30-39.9) 01/09/2023   Cellulitis 09/27/2019   IV drug user 09/27/2019       Review of Systems  Constitutional:  Negative for diaphoresis.  Eyes:  Negative for pain.  Respiratory:  Negative for shortness of breath.   Cardiovascular:  Negative for chest pain, palpitations and leg swelling.  Gastrointestinal:  Negative for abdominal pain.  Endocrine: Negative for polydipsia.  Skin:  Negative for rash.  Neurological:  Negative for dizziness, weakness and headaches.  Hematological:  Does not bruise/bleed easily.  All other systems reviewed and are negative.      Objective:   Physical Exam Constitutional:      Appearance: Normal appearance.  Skin:    Comments: See photo of leg  Neurological:     Mental Status: He is alert.      Wet to dry dressing applied     Assessment & Plan:   Ian Ali in today with chief complaint of Animal Bite   1. Dog bite of left calf, subsequent encounter Continue satyl bream as prescribed Finish oral antibiotics Continue we t to dry dressing BID Keep appt with wound care RTO if worsens   The above assessment and management plan was discussed with the patient. The patient verbalized understanding of and has agreed to the management plan. Patient is aware to call the clinic if symptoms persist or worsen. Patient is aware when to return  to the clinic for a follow-up visit. Patient educated on when it is appropriate to go to the emergency department.   Mary-Margaret Daphine Deutscher, FNP

## 2023-02-09 NOTE — Therapy (Unsigned)
OUTPATIENT PHYSICAL THERAPY Wound EVALUATION   Patient Name: Ian Ali MRN: [id number] DOB:Apr 13, 1981, 42 y.o., male Today's Date: 02/10/2023   PCP: none REFERRING PROVIDER: Hazeline Junker  END OF SESSION:  PT End of Session - 02/10/23 0935     Visit Number 1    Number of Visits 12    Date for PT Re-Evaluation 03/24/23    Authorization Type vaya  please see auth    Progress Note Due on Visit 10    PT Start Time 0910   pt late   PT Stop Time 0935    PT Time Calculation (min) 25 min    Activity Tolerance Patient limited by pain    Behavior During Therapy [facility] for tasks assessed/performed             Past Medical History:  Diagnosis Date   Substance abuse (HCC)    No past surgical history on file. Patient Active Problem List   Diagnosis Date Noted   Puncture wound of left calf 01/09/2023   Tobacco use disorder 01/09/2023   Obesity (BMI 30-39.9) 01/09/2023   Cellulitis 09/27/2019   IV drug user 09/27/2019    ONSET DATE: 12/31/22  REFERRING DIAG: non healing dog bite  THERAPY DIAG:  Non healing dog bite Left leg pain   Rationale for Evaluation and Treatment: Rehabilitation     Wound Therapy - 02/10/23 0001     Subjective Pt bitten by a dog, became infected and was admitted into the hospital    Patient and Family Stated Goals wound to heal    Date of Onset 01/01/23    Prior Treatments antibiotics/hospitalization    Pain Scale 0-10    Pain Score 3     Pain Type Acute pain    Pain Location Leg    Pain Orientation Left;Posterior;Distal    Evaluation and Treatment Procedures Explained to Patient/Family Yes    Evaluation and Treatment Procedures agreed to    Wound Properties Date First Assessed: 02/10/23 Time First Assessed: 0915 Wound Type: Puncture Location: Leg Location Orientation: Left;Posterior Present on Admission: Yes   Wound Image Images linked: 1    Dressing Type Gauze (Comment)    Dressing Changed Changed    Dressing Status Old drainage    Dressing  Change Frequency PRN    Site / Wound Assessment Red;Yellow    % Wound base Yellow/Fibrinous Exudate 5%    Peri-wound Assessment Edema    Wound Length (cm) 4.2 cm    Wound Width (cm) 8.2 cm    Wound Depth (cm) 0.5 cm    Wound Volume (cm^3) 17.22 cm^3    Wound Surface Area (cm^2) 34.44 cm^2    Drainage Amount Minimal    Drainage Description Serous    Treatment Cleansed;Debridement (Selective)    Selective Debridement (non-excisional) - Location edges of wound    Selective Debridement (non-excisional) - Tools Used Forceps    Selective Debridement (non-excisional) - Tissue Removed biofilm and slough    Wound Therapy - Clinical Statement see below    Dressing  vaseline around the wound, xeroform, 4x4, kerlix and netting            HX:  Tony Schubring is a 42 y.o. male with a history of tobacco use, substance abuse who presented to the ED on 01/08/2023 with worsening pain from left calf wound. He had a dog bite injury 6/27, had loose sutures placed in ED, discharged with augmentin. He returned 7/3 for worsening pain, redness and foul smell. Admission  was planned before patient left AMA only to return 7/5 with continued worsening symptoms. CT in the ED showed no abscess or other complication. He was admitted for IV antibiotics with failure of outpatient management. Bedside debridement carried out 7/7, subsequently discharged on oral antibiotics 7/9 after wound care teaching    PATIENT EDUCATION: Education details: Keep dressing dry. Person educated: Patient Education method: Explanation Education comprehension: verbalized understanding   HOME EXERCISE PROGRAM: N/A   GOALS: Goals reviewed with patient? No  SHORT TERM GOALS: Target date: 03/02/23  PT  wound to be 100% granulated  Baseline: Goal status: INITIAL  2.  PT pain to be no greater than a 1 Baseline:  Goal status: INITIAL   LONG TERM GOALS: Target date: 03/23/23  Pt wound to be healed  Baseline:  Goal status:  INITIAL  2.  Pt to have no pain in his LE  Baseline:  Goal status: INITIAL   ASSESSMENT:  CLINICAL IMPRESSION: Patient is a 42 y.o. male who was seen today for physical therapy evaluation and treatment for dog bite of the left calf.  PT has has numerous bouts of antibiotics, admitted into the hospital with IV antibiotics, however, the wound has not healed.  The wound appears to be granulated but has a layer of biofilm on it with slough around the surface.  Mr. Chavira will benefit from skilled PT to create a healing environment in order for his LE wound to heal.    OBJECTIVE IMPAIRMENTS: increased edema, pain, and decreased skin integrity .   ACTIVITY LIMITATIONS: bathing, dressing, and hygiene/grooming  PARTICIPATION LIMITATIONS: shopping and community activity  PERSONAL FACTORS: Time since onset of injury/illness/exacerbation are also affecting patient's functional outcome.   REHAB POTENTIAL: Good  CLINICAL DECISION MAKING: Stable/uncomplicated  EVALUATION COMPLEXITY: Moderate  PLAN: PT FREQUENCY: 2x/week  PT DURATION: 6 weeks  PLANNED INTERVENTIONS: Patient/Family education, Self Care, and debridement  PLAN FOR NEXT SESSION: continue with cleansing and debridement of the wound assess and change dressing as appropriate to facilitate wound healing.   Virgina Organ, PT CLT [phone]                [phone]

## 2023-02-10 ENCOUNTER — Other Ambulatory Visit: Payer: Self-pay

## 2023-02-10 ENCOUNTER — Ambulatory Visit (HOSPITAL_COMMUNITY): Payer: 59 | Attending: Family Medicine | Admitting: Physical Therapy

## 2023-02-10 DIAGNOSIS — M79662 Pain in left lower leg: Secondary | ICD-10-CM | POA: Diagnosis present

## 2023-02-10 DIAGNOSIS — W540XXD Bitten by dog, subsequent encounter: Secondary | ICD-10-CM | POA: Insufficient documentation

## 2023-02-10 DIAGNOSIS — S81852D Open bite, left lower leg, subsequent encounter: Secondary | ICD-10-CM | POA: Insufficient documentation

## 2023-02-10 DIAGNOSIS — L089 Local infection of the skin and subcutaneous tissue, unspecified: Secondary | ICD-10-CM | POA: Diagnosis not present

## 2023-02-10 DIAGNOSIS — T148XXA Other injury of unspecified body region, initial encounter: Secondary | ICD-10-CM | POA: Diagnosis not present

## 2023-02-12 ENCOUNTER — Ambulatory Visit (HOSPITAL_COMMUNITY): Payer: MEDICAID | Admitting: Physical Therapy

## 2023-02-15 ENCOUNTER — Telehealth (HOSPITAL_COMMUNITY): Payer: Self-pay | Admitting: Physical Therapy

## 2023-02-15 ENCOUNTER — Ambulatory Visit (HOSPITAL_COMMUNITY): Payer: MEDICAID | Admitting: Physical Therapy

## 2023-02-15 NOTE — Telephone Encounter (Signed)
Pt did not show, NS#2 (8/9 was first NS but encounter was not created).  Called and spoke to parent who states he would relay the message "when I see him".  Per NS policy all remaining appts were cancelled with exception of next scheduled one.   Lurena Nida, PTA/CLT [facility] Health Outpatient Rehabilitation [facility] Ph: [phone]

## 2023-02-23 ENCOUNTER — Telehealth (HOSPITAL_COMMUNITY): Payer: Self-pay | Admitting: Physical Therapy

## 2023-02-23 ENCOUNTER — Ambulatory Visit (HOSPITAL_COMMUNITY): Payer: MEDICAID | Admitting: Physical Therapy

## 2023-02-23 NOTE — Telephone Encounter (Signed)
Pt did not show for appt.  #2 (unsure if 8/9 was NS as no note written).  If does not show next visit discharge.   Lurena Nida, PTA/CLT [facility] Health Outpatient Rehabilitation [facility] Ph: [phone]

## 2023-02-25 ENCOUNTER — Ambulatory Visit (HOSPITAL_COMMUNITY): Payer: MEDICAID

## 2023-03-02 ENCOUNTER — Ambulatory Visit (HOSPITAL_COMMUNITY): Payer: MEDICAID

## 2023-03-04 ENCOUNTER — Ambulatory Visit (HOSPITAL_COMMUNITY): Payer: MEDICAID | Admitting: Physical Therapy

## 2023-03-09 ENCOUNTER — Ambulatory Visit (HOSPITAL_COMMUNITY): Payer: MEDICAID

## 2023-03-11 ENCOUNTER — Ambulatory Visit (HOSPITAL_COMMUNITY): Payer: MEDICAID | Admitting: Physical Therapy

## 2023-03-11 ENCOUNTER — Encounter (HOSPITAL_COMMUNITY): Payer: Self-pay | Admitting: Physical Therapy

## 2023-03-11 ENCOUNTER — Telehealth (HOSPITAL_COMMUNITY): Payer: Self-pay | Admitting: Physical Therapy

## 2023-03-11 NOTE — Telephone Encounter (Addendum)
PHYSICAL THERAPY DISCHARGE SUMMARY  Visits from Start of Care: 1  Current functional level related to goals / functional outcomes: Unknown as pt did not show    Remaining deficits: Unknown as pt did not show   Education / Equipment: HEP   Patient agrees to discharge. Patient goals were not met. Patient is being discharged due to not returning since the last visit. Pt with three NS and now discharged per policy.  Pt notified by VM   Lurena Nida, PTA/CLT [facility] Ph: [phone] Virgina Organ, PT CLT [phone]

## 2023-03-11 NOTE — Therapy (Unsigned)
PHYSICAL THERAPY DISCHARGE SUMMARY   Visits from Start of Care: 1   Current functional level related to goals / functional outcomes: Unknown as pt did not show    Remaining deficits: Unknown as pt did not show   Education / Equipment: HEP    Patient agrees to discharge. Patient goals were not met. Patient is being discharged due to not returning since the last visit. Virgina Organ, PT CLT [phone]

## 2023-03-16 ENCOUNTER — Ambulatory Visit (HOSPITAL_COMMUNITY): Payer: MEDICAID | Admitting: Physical Therapy

## 2023-03-18 ENCOUNTER — Ambulatory Visit (HOSPITAL_COMMUNITY): Payer: MEDICAID | Admitting: Physical Therapy

## 2023-03-23 ENCOUNTER — Ambulatory Visit (HOSPITAL_COMMUNITY): Payer: MEDICAID | Admitting: Physical Therapy

## 2023-03-25 ENCOUNTER — Ambulatory Visit (HOSPITAL_COMMUNITY): Payer: MEDICAID | Admitting: Physical Therapy
